# Patient Record
Sex: Female | Born: 1955 | Race: Black or African American | Hispanic: No | Smoking: Never smoker
Health system: Southern US, Community
[De-identification: ages and names within clinical notes are randomized; demographics above are authoritative.]

## PROBLEM LIST (undated history)

## (undated) DIAGNOSIS — J189 Pneumonia, unspecified organism: Secondary | ICD-10-CM

## (undated) DIAGNOSIS — Z8489 Family history of other specified conditions: Secondary | ICD-10-CM

## (undated) DIAGNOSIS — D649 Anemia, unspecified: Secondary | ICD-10-CM

## (undated) DIAGNOSIS — M069 Rheumatoid arthritis, unspecified: Secondary | ICD-10-CM

## (undated) DIAGNOSIS — I10 Essential (primary) hypertension: Secondary | ICD-10-CM

## (undated) HISTORY — PX: NECK SURGERY: SHX720

## (undated) HISTORY — PX: BACK SURGERY: SHX140

## (undated) HISTORY — PX: ABDOMINAL HYSTERECTOMY: SHX81

## (undated) HISTORY — PX: TRIGGER FINGER RELEASE: SHX641

---
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
## 2001-09-12 ENCOUNTER — Encounter: Admission: RE | Admit: 2001-09-12 | Discharge: 2001-09-12 | Payer: Self-pay | Admitting: Unknown Physician Specialty

## 2001-09-12 ENCOUNTER — Encounter: Payer: Self-pay | Admitting: Unknown Physician Specialty

## 2004-04-08 ENCOUNTER — Ambulatory Visit: Payer: Self-pay | Admitting: Family Medicine

## 2004-12-28 ENCOUNTER — Ambulatory Visit: Payer: Self-pay | Admitting: Family Medicine

## 2004-12-30 ENCOUNTER — Ambulatory Visit: Payer: Self-pay | Admitting: Family Medicine

## 2005-03-31 ENCOUNTER — Ambulatory Visit: Payer: Self-pay | Admitting: Family Medicine

## 2005-04-12 ENCOUNTER — Ambulatory Visit: Payer: Self-pay | Admitting: Family Medicine

## 2005-04-16 ENCOUNTER — Ambulatory Visit: Payer: Self-pay | Admitting: Family Medicine

## 2005-05-11 ENCOUNTER — Encounter: Admission: RE | Admit: 2005-05-11 | Discharge: 2005-07-07 | Payer: Self-pay | Admitting: Orthopedic Surgery

## 2005-05-12 ENCOUNTER — Ambulatory Visit: Payer: Self-pay | Admitting: Family Medicine

## 2005-12-22 ENCOUNTER — Encounter (HOSPITAL_COMMUNITY): Admission: RE | Admit: 2005-12-22 | Discharge: 2006-01-21 | Payer: Self-pay | Admitting: Preventative Medicine

## 2006-02-10 ENCOUNTER — Ambulatory Visit: Payer: Self-pay | Admitting: Family Medicine

## 2014-03-01 HISTORY — PX: TOTAL KNEE ARTHROPLASTY: SHX125

## 2014-05-28 ENCOUNTER — Ambulatory Visit: Payer: Self-pay | Admitting: Physical Therapy

## 2014-06-25 ENCOUNTER — Ambulatory Visit: Payer: Non-veteran care | Attending: Orthopedic Surgery | Admitting: Physical Therapy

## 2014-06-25 DIAGNOSIS — M25561 Pain in right knee: Secondary | ICD-10-CM | POA: Insufficient documentation

## 2014-06-25 DIAGNOSIS — M25661 Stiffness of right knee, not elsewhere classified: Secondary | ICD-10-CM | POA: Diagnosis not present

## 2014-06-25 NOTE — Therapy (Signed)
[facility] [address] [place], [place], [address] Phone: [phone]   Fax:  [phone]  Physical Therapy Evaluation  Patient Details  Name: Melinda Hayes MRN: [id number] Date of Birth: 09-Nov-1955 Referring Provider:  Case, Helen Hashimoto, MD  Encounter Date: 06/25/2014      PT End of Session - 06/25/14 1436    Visit Number 1   Number of Visits 18   Date for PT Re-Evaluation 08/20/14   PT Start Time 0230   PT Stop Time 0327   PT Time Calculation (min) 57 min   Behavior During Therapy [facility] for tasks assessed/performed      No past medical history on file.  No past surgical history on file.  There were no vitals filed for this visit.  Visit Diagnosis:  Right knee pain - Plan: PT plan of care cert/re-cert  Stiffness of right knee - Plan: PT plan of care cert/re-cert      Subjective Assessment - 06/25/14 1437    Subjective [facility].   Patient Stated Goals Get back to life and reduce my right knee pain.            [facility] PT Assessment - 06/25/14 0001    Assessment   Medical Diagnosis Right total knee replacement.   Onset Date --  03/26/14.   Precautions   Precaution Comments No ultrasound.   Balance Screen   Has the patient fallen in the past 6 months No   Has the patient had a decrease in activity level because of a fear of falling?  Yes   Is the patient reluctant to leave their home because of a fear of falling?  No   ROM / Strength   AROM / PROM / Strength AROM;PROM;Strength   AROM   Overall AROM Comments -12 degrees to 82 degrees.   PROM   Overall PROM Comments -10 degrees to 90 degrees.   Strength   Overall Strength Comments Righ thip strength= 4-/5 and right knee strength= 4/5.   Palpation   Palpation --  Tender around right patella and ant tib region.   Special Tests   Swelling Tests --  Mid-pat circum measurement 4 cms > on right.   Ambulation/Gait   Ambulation/Gait --  Walking with cane.                   [facility] Adult PT Treatment/Exercise - 06/25/14 0001    Modalities   Modalities Cryotherapy;Electrical Stimulation   Cryotherapy   Number Minutes Cryotherapy 20 Minutes   Cryotherapy Location Knee   Type of Cryotherapy --  Medium vasopneumatic.   Programme researcher, broadcasting/film/video Location --  Right knee.   Electrical Stimulation Action 1-10 Hz x 20 minutes at 100% scan.   Electrical Stimulation Goals Edema;Pain                     PT Long Term Goals - 06/25/14 1502    PT LONG TERM GOAL #1   Title Ind with HEP.   Time 6   Period Weeks   Status New   PT LONG TERM GOAL #2   Title Full active right knee extension.   Time 6   Period Weeks   Status New   PT LONG TERM GOAL #3   Title Active right knee flexion to 115 degrees+.   Time 6   Period Weeks   Status New   PT  LONG TERM GOAL #4   Title Right LE strength to 5/5 to increase stability for functional activites.   Time 6   Period Weeks   Status New   PT LONG TERM GOAL #5   Title Decrease edema to within 2 cms of contralateral side to assist with range of motion gains and decrease pain.   Time 6   Period Weeks   Status New   Additional Long Term Goals   Additional Long Term Goals Yes   PT LONG TERM GOAL #6   Title Perform a reciprocating stair gait with one railing with pain not > 3/10.   Time 6   Period Weeks   Status New               Plan - Jun 30, 2014 1440    Clinical Impression Statement The patient underwent a right total knee replacement on 03/26/14.   Patient has a h/o of right MCL laxity and was in an immobilizer for 6 weeks while walking for increased stability.  Pain rises to a 5-6/10 after being up awhile.  Ice and elevation decreases pain.     Pt will benefit from skilled therapeutic intervention in order to improve on the following deficits Decreased activity tolerance;Pain   Rehab Potential Good   PT  Frequency 3x / week   PT Duration 6 weeks   PT Treatment/Interventions ADLs/Self Care Home Management;Therapeutic activities;Patient/family education;Passive range of motion;Therapeutic exercise;Balance training;Gait training;Neuromuscular re-education;Electrical Stimulation;Cryotherapy   PT Next Visit Plan Needs one to one PROM to right knee; Nustep; TKR protocol.  E'stim and Vasopnuematic.          G-Codes - 2014/06/30 1506    Functional Assessment Tool Used FOTO   Functional Limitation Mobility: Walking and moving around   Mobility: Walking and Moving Around Current Status [phone]) At least 60 percent but less than 80 percent impaired, limited or restricted   Mobility: Walking and Moving Around Goal Status [phone]) At least 20 percent but less than 40 percent impaired, limited or restricted       Problem List There are no active problems to display for this patient.   Annisten Manchester, [place] MPT 2014/06/30, 3:30 PM  [facility] [address] [place], [place], [address] Phone: [phone]   Fax:  [phone]

## 2014-06-26 ENCOUNTER — Ambulatory Visit: Payer: Non-veteran care | Admitting: Physical Therapy

## 2014-06-26 DIAGNOSIS — M25661 Stiffness of right knee, not elsewhere classified: Secondary | ICD-10-CM | POA: Diagnosis not present

## 2014-06-26 DIAGNOSIS — M25561 Pain in right knee: Secondary | ICD-10-CM

## 2014-06-26 NOTE — Therapy (Signed)
[facility] [address] [place], [place], [address] Phone: [phone]   Fax:  [phone]  Physical Therapy Treatment  Patient Details  Name: Melinda Hayes MRN: [id number] Date of Birth: 12-Mar-1955 Referring Provider:  Case, Helen Hashimoto, MD  Encounter Date: 06/26/2014      PT End of Session - 06/26/14 1008    Visit Number 2   Number of Visits 18   Date for PT Re-Evaluation 08/20/14   PT Start Time 0945   PT Stop Time 1042   PT Time Calculation (min) 57 min   Activity Tolerance Patient tolerated treatment well   Behavior During Therapy [facility] for tasks assessed/performed      No past medical history on file.  No past surgical history on file.  There were no vitals filed for this visit.  Visit Diagnosis:  Right knee pain  Stiffness of right knee      Subjective Assessment - 06/26/14 1007    Subjective Knee didn't hurt as bad last night.                         [facility] Adult PT Treatment/Exercise - 06/26/14 0001    Exercises   Exercises Knee/Hip   Knee/Hip Exercises: Aerobic   Stationary Bike Nustep at level 2 moving forward times 3 to increase right knee flexion over 23 minutes.   Modalities   Modalities Cryotherapy;Electrical Stimulation   Cryotherapy   Number Minutes Cryotherapy 20 Minutes   Cryotherapy Location --  Right knee.   Type of Cryotherapy --  Medium vasopneumatic.   Programme researcher, broadcasting/film/video Location --  Right knee.   Electrical Stimulation Action --  1-10Hz  x 20 minutes.   Electrical Stimulation Goals Edema;Pain   Manual Therapy   Manual Therapy --  PROM into right knee extension x 5 minutes.                     PT Long Term Goals - 06/25/14 1502    PT LONG TERM GOAL #1   Title Ind with HEP.   Time 6   Period Weeks   Status New   PT LONG TERM GOAL #2   Title Full active right knee extension.   Time 6   Period Weeks   Status New   PT LONG  TERM GOAL #3   Title Active right knee flexion to 115 degrees+.   Time 6   Period Weeks   Status New   PT LONG TERM GOAL #4   Title Right LE strength to 5/5 to increase stability for functional activites.   Time 6   Period Weeks   Status New   PT LONG TERM GOAL #5   Title Decrease edema to within 2 cms of contralateral side to assist with range of motion gains and decrease pain.   Time 6   Period Weeks   Status New   Additional Long Term Goals   Additional Long Term Goals Yes   PT LONG TERM GOAL #6   Title Perform a reciprocating stair gait with one railing with pain not > 3/10.   Time 6   Period Weeks   Status New               Plan - 06/25/14 1440    Clinical Impression Statement The patient underwent a right total knee replacement on 03/26/14.   Patient has a h/o of right MCL laxity and was  in an immobilizer for 6 weeks while walking for increased stability.  Pain rises to a 5-6/10 after being up awhile.  Ice and elevation decreases pain.     Pt will benefit from skilled therapeutic intervention in order to improve on the following deficits Decreased activity tolerance;Pain   Rehab Potential Good   PT Frequency 3x / week   PT Duration 6 weeks   PT Treatment/Interventions ADLs/Self Care Home Management;Therapeutic activities;Patient/family education;Passive range of motion;Therapeutic exercise;Balance training;Gait training;Neuromuscular re-education;Electrical Stimulation;Cryotherapy   PT Next Visit Plan Needs one to one PROM to right knee; Nustep; TKR protocol.  E'stim and Vasopnuematic.          G-Codes - 07/25/2014 1506    Functional Assessment Tool Used FOTO   Functional Limitation Mobility: Walking and moving around   Mobility: Walking and Moving Around Current Status [phone]) At least 60 percent but less than 80 percent impaired, limited or restricted   Mobility: Walking and Moving Around Goal Status [phone]) At least 20 percent but less than 40 percent impaired,  limited or restricted      Problem List There are no active problems to display for this patient.   [place], [place] MPT 06/26/2014, 11:02 AM  [facility] [address] [place], [place], [address] Phone: [phone]   Fax:  [phone]

## 2014-07-02 ENCOUNTER — Ambulatory Visit: Payer: Non-veteran care | Attending: Orthopedic Surgery | Admitting: Physical Therapy

## 2014-07-02 DIAGNOSIS — M25561 Pain in right knee: Secondary | ICD-10-CM | POA: Diagnosis present

## 2014-07-02 DIAGNOSIS — M25661 Stiffness of right knee, not elsewhere classified: Secondary | ICD-10-CM | POA: Diagnosis not present

## 2014-07-02 NOTE — Therapy (Signed)
[facility] [address] [place], [place], [address] Phone: [phone]   Fax:  [phone]  Physical Therapy Treatment  Patient Details  Name: Melinda Hayes MRN: [id number] Date of Birth: 07-06-1955 Referring Provider:  Case, Helen Hashimoto, MD  Encounter Date: 07/02/2014      PT End of Session - 07/02/14 0906    Visit Number 3   Number of Visits 18   Date for PT Re-Evaluation 08/20/14   PT Start Time 0905   PT Stop Time 0959   PT Time Calculation (min) 54 min   Activity Tolerance Patient tolerated treatment well   Behavior During Therapy [facility] for tasks assessed/performed      No past medical history on file.  No past surgical history on file.  There were no vitals filed for this visit.  Visit Diagnosis:  Right knee pain  Stiffness of right knee      Subjective Assessment - 07/02/14 0907    Subjective States she is a little sore and hurting today.   Patient Stated Goals Get back to life and reduce my right knee pain.   Currently in Pain? Yes   Pain Score 4    Pain Location Knee   Pain Orientation Right   Pain Descriptors / Indicators Aching;Sore   Pain Type Surgical pain            [facility] PT Assessment - 07/02/14 0001    Assessment   Medical Diagnosis Right total knee replacement.   Onset Date 03/26/14   Next MD Visit 06/2014   PROM   Overall PROM  Deficits   Overall PROM Comments 9-90 deg of R knee                     [facility] Adult PT Treatment/Exercise - 07/02/14 0001    Knee/Hip Exercises: Aerobic   Stationary Bike NuStep L2 x30min for ROM   Knee/Hip Exercises: Standing   Forward Step Up Right;2 sets;10 reps;Hand Hold: 2;Step Height: 4"   Rocker Board 3 minutes   Modalities   Modalities Cryotherapy;Electrical Stimulation   Cryotherapy   Number Minutes Cryotherapy 15 Minutes   Cryotherapy Location Knee   Type of Cryotherapy Other (comment)  Medium vasopneumatic   Electrical Stimulation   Electrical Stimulation Location R knee   Electrical Stimulation Action IFC   Electrical Stimulation Parameters 1-10 Hz   Electrical Stimulation Goals Edema;Pain   Manual Therapy   Manual Therapy Passive ROM   Passive ROM R knee into flexion/extension with gentle holds at end range                     PT Long Term Goals - 07/02/14 0945    PT LONG TERM GOAL #1   Title Ind with HEP.   Time 6   Period Weeks   Status On-going   PT LONG TERM GOAL #2   Title Full active right knee extension.   Time 6   Period Weeks   Status On-going   PT LONG TERM GOAL #3   Title Active right knee flexion to 115 degrees+.   Time 6   Period Weeks   Status On-going   PT LONG TERM GOAL #4   Title Right LE strength to 5/5 to increase stability for functional activites.   Time 6   Period Weeks   Status On-going   PT LONG TERM GOAL #5   Title Decrease edema to within 2 cms of contralateral side to  assist with range of motion gains and decrease pain.   Time 6   Period Weeks   Status On-going   PT LONG TERM GOAL #6   Title Perform a reciprocating stair gait with one railing with pain not > 3/10.   Time 6   Period Weeks   Status On-going               Plan - 07/02/14 3235    Clinical Impression Statement Patient tolerated treatment well and had no complaints of pain during exercises. All LT goals remain on-going at this time due to increased pain and edema and decreased ROM and strength. Forward step ups were attempted with 6" step but patient lacked the needed R knee flexion to complete easily so step ups were completed with 4"  step. Firm end feels noted during R knee PROM. Normal modalties response noted following the removal of the modaltlies. Experienced 2/10 R knee pain following treatment.   Pt will benefit from skilled therapeutic intervention in order to improve on the following deficits Decreased activity tolerance;Pain   Rehab Potential Good   PT Frequency 3x / week    PT Duration 6 weeks   PT Treatment/Interventions ADLs/Self Care Home Management;Therapeutic activities;Patient/family education;Passive range of motion;Therapeutic exercise;Balance training;Gait training;Neuromuscular re-education;Electrical Stimulation;Cryotherapy   PT Next Visit Plan Continue per PT POC.   Consulted and Agree with Plan of Care Patient        Problem List There are no active problems to display for this patient.   Evelene Croon, PTA 07/02/2014, 10:04 AM  [facility] [address] [place], [place], [address] Phone: [phone]   Fax:  [phone]

## 2014-07-04 ENCOUNTER — Encounter: Payer: Self-pay | Admitting: Physical Therapy

## 2014-07-04 ENCOUNTER — Ambulatory Visit: Payer: Non-veteran care | Admitting: Physical Therapy

## 2014-07-04 DIAGNOSIS — M25661 Stiffness of right knee, not elsewhere classified: Secondary | ICD-10-CM

## 2014-07-04 DIAGNOSIS — M25561 Pain in right knee: Secondary | ICD-10-CM

## 2014-07-04 NOTE — Therapy (Signed)
[facility] [place], [place], [address] Phone: [phone]   Fax:  [phone]  Physical Therapy Treatment  Patient Details  Name: Melinda Hayes MRN: [id number] Date of Birth: 1955/06/20 Referring Provider:  Case, Reche Dixon, MD  Encounter Date: 07/04/2014      PT End of Session - 07/04/14 0946    Visit Number 4   Number of Visits 18   Date for PT Re-Evaluation 08/20/14   PT Start Time 0859   PT Stop Time 0959   PT Time Calculation (min) 60 min   Activity Tolerance Patient tolerated treatment well   Behavior During Therapy [facility] for tasks assessed/performed      History reviewed. No pertinent past medical history.  History reviewed. No pertinent past surgical history.  There were no vitals filed for this visit.  Visit Diagnosis:  Right knee pain  Stiffness of right knee      Subjective Assessment - 07/04/14 0907    Subjective not a lot of pain today. 0 up to 4/10 with activity   Patient Stated Goals Get back to life and reduce my right knee pain.   Currently in Pain? Yes   Pain Score 4    Pain Location Knee   Pain Orientation Right   Pain Descriptors / Indicators Sore   Pain Type Surgical pain   Aggravating Factors  increased activity   Pain Relieving Factors rest and ice            [facility] PT Assessment - 07/04/14 0001    ROM / Strength   AROM / PROM / Strength AROM;PROM   AROM   Overall AROM  Within functional limits for tasks performed   AROM Assessment Site Knee   Right/Left Knee Right   Right Knee Extension -10   Right Knee Flexion 87   PROM   Overall PROM  Deficits   Overall PROM Comments -8-95   PROM Assessment Site Knee   Right/Left Knee Right                     [facility] Adult PT Treatment/Exercise - 07/04/14 0001    Knee/Hip Exercises: Stretches   Knee: Self-Stretch to increase Flexion 3 reps;30 seconds   Knee/Hip Exercises: Aerobic   Stationary Bike Nustep L4 x 36mn for  ROM/strength   Knee/Hip Exercises: Standing   Forward Step Up Right;3 sets;10 reps   Rocker Board 3 minutes   Modalities   Modalities Cryotherapy;Electrical Stimulation   Cryotherapy   Number Minutes Cryotherapy 15 Minutes   Cryotherapy Location Knee   Type of Cryotherapy --  vasopnumatic   EAcupuncturistLocation R knee   Electrical Stimulation Action IFC   Electrical Stimulation Parameters 1-10HZ    Electrical Stimulation Goals Edema;Pain   Manual Therapy   Manual Therapy Passive ROM   Passive ROM low load holds for flexion/ext                PT Education - 07/04/14 0317 018 0239   Education provided Yes   Education Details HEP   Person(s) Educated Patient   Methods Explanation;Demonstration;Handout   Comprehension Verbalized understanding;Returned demonstration             PT Long Term Goals - 07/02/14 0945    PT LONG TERM GOAL #1   Title Ind with HEP.   Time 6   Period Weeks   Status On-going   PT LONG TERM GOAL #2   Title Full  active right knee extension.   Time 6   Period Weeks   Status On-going   PT LONG TERM GOAL #3   Title Active right knee flexion to 115 degrees+.   Time 6   Period Weeks   Status On-going   PT LONG TERM GOAL #4   Title Right LE strength to 5/5 to increase stability for functional activites.   Time 6   Period Weeks   Status On-going   PT LONG TERM GOAL #5   Title Decrease edema to within 2 cms of contralateral side to assist with range of motion gains and decrease pain.   Time 6   Period Weeks   Status On-going   PT LONG TERM GOAL #6   Title Perform a reciprocating stair gait with one railing with pain not > 3/10.   Time 6   Period Weeks   Status On-going               Plan - 07/04/14 6940    Clinical Impression Statement patient is slowly progressing with ROM in knee, HEP given for self ROM. patient was unable to have much therapy due to insurance per patient. no further goals met  due to ROM restrictions, edema, pain and weakness.   Pt will benefit from skilled therapeutic intervention in order to improve on the following deficits Decreased activity tolerance;Pain   Rehab Potential Good   Clinical Impairments Affecting Rehab Potential surgery 03/26/14 (14 weeks 07/02/14)   PT Frequency 3x / week   PT Duration 6 weeks   PT Treatment/Interventions ADLs/Self Care Home Management;Therapeutic activities;Patient/family education;Passive range of motion;Therapeutic exercise;Balance training;Gait training;Neuromuscular re-education;Electrical Stimulation;Cryotherapy   PT Next Visit Plan Continue per PT POC.   Consulted and Agree with Plan of Care Patient        Problem List There are no active problems to display for this patient.   Phillips Climes, PTA 07/04/2014, 9:59 AM  [facility] [place], [place], [address] Phone: [phone]   Fax:  [phone]

## 2014-07-04 NOTE — Patient Instructions (Signed)
    Place one foot on table. Straighten leg and attempt to keep it straight, then push down until feel a stretch. Hold _30__ seconds. Repeat __5-10_ times each leg, alternating. Do _2-4__ sessions per day.  Chair Knee Flexion   Keeping feet on floor, slide foot of operated leg back, bending knee. Hold ___30_ seconds. Repeat __5__ times. Do __2-4__ sessions a day.  Heel Slide   Bend left knee and pull heel toward buttocks. Use strap around foot and pull strap with arms to assist knee to bend further. Hold 10 secs.  Repeat _10___ times. Do __3-4__ sessions per day.

## 2014-07-09 ENCOUNTER — Encounter: Payer: Non-veteran care | Admitting: Physical Therapy

## 2014-07-11 ENCOUNTER — Encounter: Payer: Self-pay | Admitting: *Deleted

## 2014-07-11 ENCOUNTER — Ambulatory Visit: Payer: Non-veteran care | Admitting: *Deleted

## 2014-07-11 DIAGNOSIS — M25561 Pain in right knee: Secondary | ICD-10-CM

## 2014-07-11 DIAGNOSIS — M25661 Stiffness of right knee, not elsewhere classified: Secondary | ICD-10-CM | POA: Diagnosis not present

## 2014-07-11 NOTE — Therapy (Signed)
[facility] [address] [place], [place], [address] Phone: [phone]   Fax:  [phone]  Physical Therapy Treatment  Patient Details  Name: Melinda Hayes MRN: [id number] Date of Birth: 13-Sep-1955 Referring Provider:  Case, Helen Hashimoto, MD  Encounter Date: 07/11/2014      PT End of Session - 07/11/14 1110    Visit Number 5   Number of Visits 18   Date for PT Re-Evaluation 08/20/14   PT Start Time 0854   PT Stop Time 0955   PT Time Calculation (min) 61 min   Activity Tolerance Patient tolerated treatment well   Behavior During Therapy [facility] for tasks assessed/performed      History reviewed. No pertinent past medical history.  History reviewed. No pertinent past surgical history.  There were no vitals filed for this visit.  Visit Diagnosis:  Right knee pain  Stiffness of right knee      Subjective Assessment - 07/11/14 1001    Subjective pain has bee 5-6/10. Has been doing HEP 3-4 times a day. Hurts bottom of knee all the way across. Back of knee sore too.   Limitations Walking;House hold activities   Patient Stated Goals wants less pain and swelling   Currently in Pain? Yes   Pain Score 6    Pain Location Knee   Pain Orientation Right   Pain Descriptors / Indicators Aching;Tightness;Constant;Sore   Pain Type Surgical pain   Pain Onset More than a month ago   Pain Frequency Constant   Effect of Pain on Daily Activities rest and ice   Multiple Pain Sites No                         [facility] Adult PT Treatment/Exercise - 07/11/14 0001    Knee/Hip Exercises: Aerobic   Stationary Bike --  Nustep x 15 min L4   Modalities   Modalities --  cold pack, ES   Cryotherapy   Number Minutes Cryotherapy 20 Minutes   Cryotherapy Location Knee   Type of Cryotherapy --  cold pack   Electrical Stimulation   Electrical Stimulation Location --  rt knee   Electrical Stimulation Action --  IFC   Electrical  Stimulation Parameters --  1-10 HZ   Electrical Stimulation Goals --  edema pain   Manual Therapy   Manual Therapy --  x25 min of stretching into extesion and flexion with holds   Passive ROM --  low load long duration holds                     PT Long Term Goals - 07/02/14 0945    PT LONG TERM GOAL #1   Title Ind with HEP.   Time 6   Period Weeks   Status On-going   PT LONG TERM GOAL #2   Title Full active right knee extension.   Time 6   Period Weeks   Status On-going   PT LONG TERM GOAL #3   Title Active right knee flexion to 115 degrees+.   Time 6   Period Weeks   Status On-going   PT LONG TERM GOAL #4   Title Right LE strength to 5/5 to increase stability for functional activites.   Time 6   Period Weeks   Status On-going   PT LONG TERM GOAL #5   Title Decrease edema to within 2 cms of contralateral side to assist with range of motion gains and  decrease pain.   Time 6   Period Weeks   Status On-going   PT LONG TERM GOAL #6   Title Perform a reciprocating stair gait with one railing with pain not > 3/10.   Time 6   Period Weeks   Status On-going               Plan - 01-Aug-2014 1112    Clinical Impression Statement Patient trying to do HEP and feels it will improve her ROM and strength. So far, it has made the pain and edema worse.  ROM is -10 ext  and 95 flex   Pt will benefit from skilled therapeutic intervention in order to improve on the following deficits Abnormal gait;Decreased strength;Pain;Increased edema;Decreased range of motion;Decreased endurance   Rehab Potential Good   PT Frequency 3x / week   PT Duration 6 weeks   PT Treatment/Interventions ADLs/Self Care Home Management;Therapeutic activities;Manual techniques;Therapeutic exercise;Cryotherapy;Electrical Stimulation;Gait training;Moist Heat;Passive range of motion   PT Next Visit Plan cont poc   Consulted and Agree with Plan of Care Patient          G-Codes - August 01, 2014  1127    Functional Assessment Tool Used Foto    Intake status 26%, today status 43%        Intake limitation 74%, today 57%   Functional Limitation Mobility: Walking and moving around      Problem List There are no active problems to display for this patient.   Cristal Ford P,PTA  2014-08-01, 11:33 AM  [facility] [address] [place], [place], [address] Phone: [phone]   Fax:  [phone]

## 2014-07-12 ENCOUNTER — Ambulatory Visit: Payer: Non-veteran care | Admitting: *Deleted

## 2014-07-12 DIAGNOSIS — M25661 Stiffness of right knee, not elsewhere classified: Secondary | ICD-10-CM

## 2014-07-12 DIAGNOSIS — M25561 Pain in right knee: Secondary | ICD-10-CM

## 2014-07-12 NOTE — Therapy (Signed)
[facility] [address] [place], [place], [address] Phone: [phone]   Fax:  [phone]  Physical Therapy Treatment  Patient Details  Name: Melinda Hayes MRN: [id number] Date of Birth: 11-Jan-1956 Referring Provider:  Case, Helen Hashimoto, MD  Encounter Date: 07/12/2014      PT End of Session - 07/12/14 0911    Visit Number 6   Number of Visits 18   Date for PT Re-Evaluation 08/20/14   PT Start Time 0900   PT Stop Time 0959   PT Time Calculation (min) 59 min      No past medical history on file.  No past surgical history on file.  There were no vitals filed for this visit.  Visit Diagnosis:  Right knee pain  Stiffness of right knee      Subjective Assessment - 07/12/14 0913    Subjective pain has bee 3/10. Has been doing HEP 3-4 times a day. Hurts bottom of knee all the way across. Back of knee sore too.Able to sleep better last night   Limitations Walking;House hold activities   Patient Stated Goals wants less pain and swelling   Currently in Pain? Yes   Pain Score 3    Pain Location Knee   Pain Orientation Right   Pain Descriptors / Indicators Aching;Tightness   Pain Type Surgical pain   Pain Onset More than a month ago   Pain Frequency Intermittent   Aggravating Factors  increased activity   Pain Relieving Factors rest,ice                         [facility] Adult PT Treatment/Exercise - 07/12/14 0001    Exercises   Exercises Knee/Hip   Knee/Hip Exercises: Aerobic   Stationary Bike Nustep L4 x for ROM/strength   Knee/Hip Exercises: Standing   Rocker Board 4 minutes   Other Standing Knee Exercises *in block flexion lunges x10   Modalities   Modalities Cryotherapy;Electrical Stimulation   Cryotherapy   Number Minutes Cryotherapy 15 Minutes   Cryotherapy Location Knee   Type of Cryotherapy Ice pack  Vasopnuematic   Programme researcher, broadcasting/film/video Location R knee   Electrical Stimulation Action IFC   Electrical Stimulation Parameters 1-10hz  x15 min   Electrical Stimulation Goals Edema   Manual Therapy   Manual Therapy Passive ROM;Soft tissue mobilization   Soft tissue mobilization IASTM to RT knee around incision and med/lat aspect. STW post. aspect   Passive ROM low load holds for flexion/ext and HS stretches using contract/relax                     PT Long Term Goals - 07/02/14 0945    PT LONG TERM GOAL #1   Title Ind with HEP.   Time 6   Period Weeks   Status On-going   PT LONG TERM GOAL #2   Title Full active right knee extension.   Time 6   Period Weeks   Status On-going   PT LONG TERM GOAL #3   Title Active right knee flexion to 115 degrees+.   Time 6   Period Weeks   Status On-going   PT LONG TERM GOAL #4   Title Right LE strength to 5/5 to increase stability for functional activites.   Time 6   Period Weeks   Status On-going   PT LONG TERM GOAL #5   Title Decrease edema to within 2 cms of  contralateral side to assist with range of motion gains and decrease pain.   Time 6   Period Weeks   Status On-going   PT LONG TERM GOAL #6   Title Perform a reciprocating stair gait with one railing with pain not > 3/10.   Time 6   Period Weeks   Status On-going               Plan - 07/12/14 0912    Clinical Impression Statement Pt did good with exs and activities. She had less soreness and pain Posterior aspect of RT knee today. Her ROM for flexion has progressed to 95 degrees. I discussed with her about performing her flexion stretches  on steps at home to increase ROM   Pt will benefit from skilled therapeutic intervention in order to improve on the following deficits Abnormal gait;Decreased strength;Pain;Increased edema;Decreased range of motion;Decreased endurance   Rehab Potential Good   Clinical Impairments Affecting Rehab Potential surgery 03/26/14 (14 weeks 07/02/14)   PT Duration 6 weeks   PT  Treatment/Interventions ADLs/Self Care Home Management;Therapeutic activities;Manual techniques;Therapeutic exercise;Cryotherapy;Electrical Stimulation;Gait training;Moist Heat;Passive range of motion   PT Next Visit Plan cont poc   Consulted and Agree with Plan of Care Patient          G-Codes - 2014/07/27 1127    Functional Assessment Tool Used Foto    Intake status 26%, today status 43%        Intake limitation 74%, today 57%   Functional Limitation Mobility: Walking and moving around      Problem List There are no active problems to display for this patient.   Shneur Whittenburg,CHRIS, PTA 07/12/2014, 11:14 AM  [facility] [address] [place], [place], [address] Phone: [phone]   Fax:  [phone]

## 2014-07-16 ENCOUNTER — Ambulatory Visit: Payer: Non-veteran care | Admitting: Physical Therapy

## 2014-07-16 DIAGNOSIS — M25661 Stiffness of right knee, not elsewhere classified: Secondary | ICD-10-CM | POA: Diagnosis not present

## 2014-07-16 DIAGNOSIS — M25561 Pain in right knee: Secondary | ICD-10-CM

## 2014-07-16 NOTE — Therapy (Signed)
[facility] [address] [place], [place], [address] Phone: [phone]   Fax:  [phone]  Physical Therapy Treatment  Patient Details  Name: Melinda Hayes MRN: [id number] Date of Birth: [birth year] Referring Provider:  Case, Helen Hashimoto, MD  Encounter Date: 07/16/2014      PT End of Session - 07/16/14 0904    Visit Number 7   Number of Visits 18   Date for PT Re-Evaluation 08/20/14   PT Start Time 0900   PT Stop Time 0949   PT Time Calculation (min) 49 min   Activity Tolerance Patient tolerated treatment well   Behavior During Therapy [facility] for tasks assessed/performed      No past medical history on file.  No past surgical history on file.  There were no vitals filed for this visit.  Visit Diagnosis:  Right knee pain  Stiffness of right knee      Subjective Assessment - 07/16/14 0901    Subjective States this morning she woke up real stiff.   Limitations Walking;House hold activities   Patient Stated Goals wants less pain and swelling   Currently in Pain? Yes   Pain Score 4    Pain Location Knee   Pain Orientation Right   Pain Descriptors / Indicators Aching;Sore   Pain Type Surgical pain   Pain Onset More than a month ago            [facility] PT Assessment - 07/16/14 0001    Assessment   Medical Diagnosis Right total knee replacement.   Onset Date 03/26/14   Next MD Visit 06/2014                     [facility] Adult PT Treatment/Exercise - 07/16/14 0001    Knee/Hip Exercises: Stretches   Knee: Self-Stretch to increase Flexion 3 reps;30 seconds   Knee/Hip Exercises: Aerobic   Stationary Bike NuStep L6 x48min   Knee/Hip Exercises: Standing   Rocker Board 3 minutes   Knee/Hip Exercises: Seated   Other Seated Knee Exercises --   Modalities   Modalities Cryotherapy;Electrical Stimulation   Cryotherapy   Number Minutes Cryotherapy 15 Minutes   Cryotherapy Location Knee   Type of Cryotherapy Other  (comment)  Medium Vasopneumatic   Electrical Stimulation   Electrical Stimulation Location R knee   Electrical Stimulation Action IFC   Electrical Stimulation Parameters 1-10 Hz x15 min   Electrical Stimulation Goals Edema;Pain   Manual Therapy   Manual Therapy Passive ROM   Passive ROM RLE into flexion/extension with gentle holds at end range                     PT Long Term Goals - 07/16/14 7425    PT LONG TERM GOAL #1   Title Ind with HEP.   Time 6   Period Weeks   Status Achieved   PT LONG TERM GOAL #2   Title Full active right knee extension.   Time 6   Period Weeks   Status On-going   PT LONG TERM GOAL #3   Title Active right knee flexion to 115 degrees+.   Time 6   Period Weeks   Status On-going   PT LONG TERM GOAL #4   Title Right LE strength to 5/5 to increase stability for functional activites.   Time 6   Period Weeks   Status On-going   PT LONG TERM GOAL #5   Title Decrease edema to within 2  cms of contralateral side to assist with range of motion gains and decrease pain.   Time 6   Period Weeks   Status On-going   PT LONG TERM GOAL #6   Title Perform a reciprocating stair gait with one railing with pain not > 3/10.   Time 6   Period Weeks   Status On-going               Plan - 07/16/14 0935    Clinical Impression Statement Patient completed exercises well while dealing with stiffness in RLE today. Firm end feels noted during PROM of R knee. Required a short rest break following PROM of R knee due to pain. Required increased time during exercises in order to complete exericses. Normal modalties response noted following the removal of the modalties. Denied pain only experienced numbness following vasopneumatic system.   Pt will benefit from skilled therapeutic intervention in order to improve on the following deficits Abnormal gait;Decreased strength;Pain;Increased edema;Decreased range of motion;Decreased endurance   Rehab Potential  Good   Clinical Impairments Affecting Rehab Potential surgery 03/26/14 (14 weeks 07/02/14)   PT Frequency 3x / week   PT Duration 6 weeks   PT Treatment/Interventions ADLs/Self Care Home Management;Therapeutic activities;Manual techniques;Therapeutic exercise;Cryotherapy;Electrical Stimulation;Gait training;Moist Heat;Passive range of motion   PT Next Visit Plan Continue per PT POC. Consider continuing contract/relax to increase R knee flexion and reassess R knee ROM next treatment.   Consulted and Agree with Plan of Care Patient        Problem List There are no active problems to display for this patient.   Evelene Croon, PTA 07/16/2014, 9:53 AM  [facility] [address] [place], [place], [address] Phone: [phone]   Fax:  [phone]

## 2014-07-18 ENCOUNTER — Ambulatory Visit: Payer: Non-veteran care | Admitting: *Deleted

## 2014-07-18 ENCOUNTER — Encounter: Payer: Self-pay | Admitting: *Deleted

## 2014-07-18 DIAGNOSIS — M25661 Stiffness of right knee, not elsewhere classified: Secondary | ICD-10-CM | POA: Diagnosis not present

## 2014-07-18 DIAGNOSIS — M25561 Pain in right knee: Secondary | ICD-10-CM

## 2014-07-18 NOTE — Therapy (Signed)
[facility] [address] [place], [place], [address] Phone: [phone]   Fax:  [phone]  Patient Details  Name: Melinda Hayes MRN: [id number] Date of Birth: 03-15-1955 Referring Provider:  Case, Helen Hashimoto, MD  Encounter Date: 07/18/2014   Cristal Ford P,PTA 07/18/2014, 10:24 AM  [facility] [address] [place], [place], [address] Phone: [phone]   Fax:  [phone]

## 2014-07-23 ENCOUNTER — Ambulatory Visit: Payer: Non-veteran care | Admitting: Physical Therapy

## 2014-07-23 DIAGNOSIS — M25661 Stiffness of right knee, not elsewhere classified: Secondary | ICD-10-CM | POA: Diagnosis not present

## 2014-07-23 DIAGNOSIS — M25561 Pain in right knee: Secondary | ICD-10-CM

## 2014-07-23 NOTE — Therapy (Signed)
[facility] [address] [place], [place], [address] Phone: [phone]   Fax:  [phone]  Physical Therapy Treatment  Patient Details  Name: Melinda Hayes MRN: [id number] Date of Birth: Jan 25, 1956 Referring Provider:  Case, Helen Hashimoto, MD  Encounter Date: 07/23/2014      PT End of Session - 07/23/14 0905    Visit Number 9   Number of Visits 18   Date for PT Re-Evaluation 08/20/14   PT Start Time 0859   PT Stop Time 0959   PT Time Calculation (min) 60 min   Activity Tolerance Patient tolerated treatment well   Behavior During Therapy [facility] for tasks assessed/performed      No past medical history on file.  No past surgical history on file.  There were no vitals filed for this visit.  Visit Diagnosis:  Right knee pain  Stiffness of right knee      Subjective Assessment - 07/23/14 0903    Subjective States that she fell last Friday but not on her knee but is real sore and achey afterward.   Limitations Walking;House hold activities   Patient Stated Goals wants less pain and swelling   Currently in Pain? Yes   Pain Score 4    Pain Location Knee   Pain Orientation Right   Pain Descriptors / Indicators Aching;Sore   Pain Type Surgical pain   Pain Onset More than a month ago   Pain Frequency Constant            [facility] PT Assessment - 07/23/14 0001    Assessment   Medical Diagnosis Right total knee replacement.   Onset Date/Surgical Date 03/26/14   Next MD Visit 07/23/2014   ROM / Strength   AROM / PROM / Strength AROM   AROM   AROM Assessment Site Knee   Right/Left Knee Right   Right Knee Extension -4   Right Knee Flexion 85   Special Tests   Swelling Tests --  L knee midpatellar 52 cm, R knee midpatellar 54 cm                     [facility] Adult PT Treatment/Exercise - 07/23/14 0001    Knee/Hip Exercises: Aerobic   Stationary Bike NuStep L6 x58min    Knee/Hip Exercises: Standing   Forward Lunges  Right;3 sets;10 reps   Lateral Step Up Right;3 sets;10 reps;Step Height: 6"   Forward Step Up Right;3 sets;10 reps;Step Height: 6"   Rocker Board 3 minutes   Knee/Hip Exercises: Seated   Other Seated Knee Exercises --   Modalities   Modalities Cryotherapy;Electrical Stimulation   Cryotherapy   Number Minutes Cryotherapy 15 Minutes   Cryotherapy Location Knee   Type of Cryotherapy Other (comment)  Medium vasopneumatic   Electrical Stimulation   Electrical Stimulation Location R knee   Electrical Stimulation Action IFC   Electrical Stimulation Parameters 1-10 Hz x8min   Electrical Stimulation Goals Edema;Pain   Manual Therapy   Manual Therapy Passive ROM   Passive ROM RLE into flexion/extension with gentle holds at end range                     PT Long Term Goals - 07/23/14 0909    PT LONG TERM GOAL #1   Title Ind with HEP.   Time 6   Period Weeks   Status Achieved   PT LONG TERM GOAL #2   Title Full active right knee extension.  Time 6   Period Weeks   Status On-going   PT LONG TERM GOAL #3   Title Active right knee flexion to 115 degrees+.   Time 6   Period Weeks   Status On-going   PT LONG TERM GOAL #4   Title Right LE strength to 5/5 to increase stability for functional activites.   Time 6   Period Weeks   Status On-going   PT LONG TERM GOAL #5   Title Decrease edema to within 2 cms of contralateral side to assist with range of motion gains and decrease pain.   Time 6   Period Weeks   Status Achieved   PT LONG TERM GOAL #6   Title Perform a reciprocating stair gait with one railing with pain not > 3/10.   Time 6   Period Weeks   Status Achieved               Plan - 07/23/14 0945    Clinical Impression Statement Patient tolerated treatment well today. Acheived LT goal #5(edema within 2cm of each other) and #6 (able to ambulate stairs reciprically). AROM of R knee 4-85 deg today. From observation of B knees swelling seems to be  concentrated around R patella and inferior to the knee joint. Normal modalties response noted following removal of the modalites.    Pt will benefit from skilled therapeutic intervention in order to improve on the following deficits Abnormal gait;Decreased strength;Pain;Decreased activity tolerance;Increased edema;Decreased range of motion;Decreased endurance   Rehab Potential Good   Clinical Impairments Affecting Rehab Potential surgery 03/26/14 (14 weeks 07/02/14)   PT Frequency 3x / week   PT Duration 6 weeks   PT Treatment/Interventions ADLs/Self Care Home Management;Therapeutic activities;Manual techniques;Therapeutic exercise;Cryotherapy;Electrical Stimulation;Scar mobilization   PT Next Visit Plan Continue per PT POC. Sees Dr. Case 07/22/2013   Consulted and Agree with Plan of Care Patient        Problem List There are no active problems to display for this patient.   Florence Canner, PTA 07/23/2014 10:05 AM   [facility] [address] [place], [place], [address] Phone: [phone]   Fax:  [phone]

## 2014-07-25 ENCOUNTER — Ambulatory Visit: Payer: Non-veteran care | Admitting: Physical Therapy

## 2014-07-25 DIAGNOSIS — M25661 Stiffness of right knee, not elsewhere classified: Secondary | ICD-10-CM

## 2014-07-25 DIAGNOSIS — M25561 Pain in right knee: Secondary | ICD-10-CM

## 2014-07-25 NOTE — Therapy (Signed)
[facility] [place], [place], [address] Phone: [phone]   Fax:  [phone]  Physical Therapy Treatment  Patient Details  Name: Melinda Hayes MRN: [id number] Date of Birth: 1956-02-17 Referring Provider:  Case, Reche Dixon, MD  Encounter Date: 07/25/2014      PT End of Session - 07/25/14 0904    Visit Number 10   Number of Visits 18   Date for PT Re-Evaluation 08/20/14   PT Start Time 0903   PT Stop Time 0956   PT Time Calculation (min) 53 min   Activity Tolerance Patient tolerated treatment well   Behavior During Therapy [facility] for tasks assessed/performed      No past medical history on file.  No past surgical history on file.  There were no vitals filed for this visit.  Visit Diagnosis:  Right knee pain  Stiffness of right knee      Subjective Assessment - 07/25/14 0904    Subjective Still a little sore from the fall last week. Dr. Case said that everything seemed to look good to him.   Limitations Walking;House hold activities   Patient Stated Goals wants less pain and swelling   Currently in Pain? No/denies            [facility] PT Assessment - 07/25/14 0001    Assessment   Medical Diagnosis Right total knee replacement.   Onset Date/Surgical Date 03/26/14                     [facility] Adult PT Treatment/Exercise - 07/25/14 0001    Knee/Hip Exercises: Aerobic   Stationary Bike NuStep L7 x46mn   Knee/Hip Exercises: Standing   Forward Lunges Right;3 sets;10 reps   Lateral Step Up Right;3 sets;10 reps;Step Height: 6"   Forward Step Up Right;3 sets;10 reps;Step Height: 6"   Modalities   Modalities Cryotherapy;Electrical Stimulation   Cryotherapy   Number Minutes Cryotherapy 15 Minutes   Cryotherapy Location Knee   Type of Cryotherapy Other (comment)  Medium Vasopneumatic    Electrical Stimulation   Electrical Stimulation Location R knee   Electrical Stimulation Action IFC   Electrical  Stimulation Parameters 1-10Hz  x15 min   Electrical Stimulation Goals Edema;Pain   Manual Therapy   Manual Therapy Passive ROM   Passive ROM RLE into flexion/extension with gentle holds at end range                     PT Long Term Goals - 07/25/14 09702   PT LONG TERM GOAL #1   Title Ind with HEP.   Time 6   Period Weeks   Status Achieved   PT LONG TERM GOAL #2   Title Full active right knee extension.   Time 6   Period Weeks   Status On-going   PT LONG TERM GOAL #3   Title Active right knee flexion to 115 degrees+.   Time 6   Period Weeks   Status On-going   PT LONG TERM GOAL #4   Title Right LE strength to 5/5 to increase stability for functional activites.   Time 6   Period Weeks   Status On-going   PT LONG TERM GOAL #5   Title Decrease edema to within 2 cms of contralateral side to assist with range of motion gains and decrease pain.   Time 6   Period Weeks   Status Achieved   PT LONG TERM GOAL #6   Title Perform  a reciprocating stair gait with one railing with pain not > 3/10.   Time 6   Period Weeks   Status Achieved               Plan - 07/25/14 0944    Clinical Impression Statement Patient tolerated treatment well today without complaint of pain during exericses. No goals were met today. Continued observation of swelling mostly around R patella and inferior to R knee joint. Normal modalties response noted following removal of the modalties. Denied pain following trreatment.   Pt will benefit from skilled therapeutic intervention in order to improve on the following deficits Abnormal gait;Decreased strength;Pain;Decreased activity tolerance;Increased edema;Decreased range of motion;Decreased endurance   Rehab Potential Good   Clinical Impairments Affecting Rehab Potential surgery 03/26/14 (14 weeks 07/02/14)   PT Frequency 3x / week   PT Duration 6 weeks   PT Treatment/Interventions ADLs/Self Care Home Management;Therapeutic activities;Manual  techniques;Therapeutic exercise;Cryotherapy;Electrical Stimulation;Scar mobilization   PT Next Visit Plan Continue per PT POC. Notify MPT of need to start [facility]   Consulted and Agree with Plan of Care Patient        Problem List There are no active problems to display for this patient.   Ahmed Prima, PTA 07/25/2014 10:02 AM   [facility] [address] [place], [place], [address] Phone: [phone]   Fax:  [phone]

## 2014-07-30 ENCOUNTER — Ambulatory Visit: Payer: Non-veteran care | Admitting: Physical Therapy

## 2014-07-30 DIAGNOSIS — M25561 Pain in right knee: Secondary | ICD-10-CM

## 2014-07-30 DIAGNOSIS — M25661 Stiffness of right knee, not elsewhere classified: Secondary | ICD-10-CM | POA: Diagnosis not present

## 2014-07-30 NOTE — Patient Instructions (Signed)
Knee Flexion: Resisted (Sitting)   Sit with band under left foot and looped around ankle of supported leg. Pull unsupported leg back. Repeat __10__ times per set. Do __3__ sets per session. Do __2__ sessions per day.  http://orth.exer.us/694   Copyright  VHI. All rights reserved.  Knee Extension: Resisted (Sitting)   With band looped around right ankle and under other foot, straighten leg with ankle loop. Keep other leg bent to increase resistance. Repeat __10__ times per set. Do __3__ sets per session. Do __2__ sessions per day.  http://orth.exer.us/690   Copyright  VHI. All rights reserved.

## 2014-07-30 NOTE — Therapy (Signed)
[facility] [place], [place], [address] Phone: [phone]   Fax:  [phone]  Physical Therapy Treatment  Patient Details  Name: Melinda Hayes MRN: [id number] Date of Birth: 1955-08-25 Referring Provider:  Case, Reche Dixon, MD  Encounter Date: 07/30/2014      PT End of Session - 07/30/14 0906    Visit Number 11   Number of Visits 18   Date for PT Re-Evaluation 08/20/14   PT Start Time 0906   PT Stop Time 0954   PT Time Calculation (min) 48 min   Activity Tolerance Patient tolerated treatment well   Behavior During Therapy [facility] for tasks assessed/performed      No past medical history on file.  No past surgical history on file.  There were no vitals filed for this visit.  Visit Diagnosis:  Right knee pain  Stiffness of right knee      Subjective Assessment - 07/30/14 0906    Subjective States that she has some stiffness and some tingling. Reports a popping sensation in R knee as well.   Limitations Walking;House hold activities   Patient Stated Goals wants less pain and swelling   Currently in Pain? Yes   Pain Score 5    Pain Location Knee   Pain Orientation Right   Pain Descriptors / Indicators Other (Comment);Tingling  Stiffness   Pain Type Surgical pain   Pain Onset More than a month ago            [facility] PT Assessment - 07/30/14 0001    Assessment   Medical Diagnosis Right total knee replacement.   Onset Date/Surgical Date 03/26/14                     [facility] Adult PT Treatment/Exercise - 07/30/14 0001    Knee/Hip Exercises: Aerobic   Stationary Bike NuStep L8 x70mn   Knee/Hip Exercises: Standing   Forward Lunges Right;3 sets;10 reps   Rocker Board 3 minutes   Knee/Hip Exercises: Seated   Other Seated Knee Exercises L knee ext/ HS curl red theraband x30 reps each   Modalities   Modalities Cryotherapy;Electrical Stimulation   Cryotherapy   Number Minutes Cryotherapy 15  Minutes   Cryotherapy Location Knee   Type of Cryotherapy Ice pack   Electrical Stimulation   Electrical Stimulation Location R knee   Electrical Stimulation Action IFC   Electrical Stimulation Parameters 1-10 Hz x131m   Electrical Stimulation Goals Edema;Pain   Manual Therapy   Manual Therapy Passive ROM   Passive ROM RLE into flexion/extension with gentle holds at end range                PT Education - 07/30/14 0941    Education provided Yes   Education Details HEP- seated HS curl and knee extension strengthening with red theraband    Person(s) Educated Patient   Methods Explanation;Demonstration;Verbal cues;Handout   Comprehension Verbalized understanding;Returned demonstration;Verbal cues required             PT Long Term Goals - 07/30/14 0908    PT LONG TERM GOAL #1   Title Ind with HEP.   Time 6   Period Weeks   Status Achieved   PT LONG TERM GOAL #2   Title Full active right knee extension.   Time 6   Period Weeks   Status On-going   PT LONG TERM GOAL #3   Title Active right knee flexion to 115 degrees+.   Time  6   Period Weeks   Status On-going   PT LONG TERM GOAL #4   Title Right LE strength to 5/5 to increase stability for functional activites.   Time 6   Period Weeks   Status On-going   PT LONG TERM GOAL #5   Title Decrease edema to within 2 cms of contralateral side to assist with range of motion gains and decrease pain.   Time 6   Period Weeks   Status Achieved   PT LONG TERM GOAL #6   Title Perform a reciprocating stair gait with one railing with pain not > 3/10.   Time 6   Period Weeks   Status Achieved               Plan - 07/30/14 9558    Clinical Impression Statement Patient tolerated treatment well today without complaint of pain during exercises. No goals were met today due to decreased R knee extension, flexion, strength. Continued observation of swelling mostly around R patella and inferior to R knee. Observed the  popping sensation during palpation of the R knee during R forward lunge. Normal modalites response noted following removal of the modalties. Accepted new HEP for R knee strengthening and red theraband this visit. Denied pain only numbness following treatment.   Pt will benefit from skilled therapeutic intervention in order to improve on the following deficits Abnormal gait;Decreased strength;Pain;Decreased activity tolerance;Increased edema;Decreased range of motion;Decreased endurance   Clinical Impairments Affecting Rehab Potential surgery 03/26/14 (14 weeks 07/02/14)   PT Frequency 3x / week   PT Duration 6 weeks   PT Treatment/Interventions ADLs/Self Care Home Management;Therapeutic activities;Manual techniques;Therapeutic exercise;Cryotherapy;Electrical Stimulation;Scar mobilization   PT Next Visit Plan Continue per PT POC. Consider IASTW to R posterior knee next session to decrease R hamstring tightness in effort to advance R knee ROM.   Consulted and Agree with Plan of Care Patient        Problem List There are no active problems to display for this patient.   Wynelle Fanny, PTA 07/30/2014, 9:57 AM  [facility] [address] [place], [place], [address] Phone: [phone]   Fax:  [phone]

## 2014-08-01 ENCOUNTER — Ambulatory Visit: Payer: No Typology Code available for payment source | Attending: Orthopedic Surgery | Admitting: Physical Therapy

## 2014-08-01 DIAGNOSIS — M25661 Stiffness of right knee, not elsewhere classified: Secondary | ICD-10-CM | POA: Diagnosis present

## 2014-08-01 DIAGNOSIS — M25561 Pain in right knee: Secondary | ICD-10-CM | POA: Insufficient documentation

## 2014-08-01 NOTE — Therapy (Signed)
[facility] [address] [place], [place], [address] Phone: [phone]   Fax:  [phone]  Physical Therapy Treatment  Patient Details  Name: Sherrill Buikema MRN: [id number] Date of Birth: [birth year] Referring Provider:  Case, Helen Hashimoto, MD  Encounter Date: 08/01/2014      PT End of Session - 08/01/14 0907    Visit Number 12   Number of Visits 18   Date for PT Re-Evaluation 08/20/14   PT Start Time 0904   PT Stop Time 0953   PT Time Calculation (min) 49 min   Activity Tolerance Patient tolerated treatment well   Behavior During Therapy [facility] for tasks assessed/performed      No past medical history on file.  No past surgical history on file.  There were no vitals filed for this visit.  Visit Diagnosis:  Right knee pain  Stiffness of right knee      Subjective Assessment - 08/01/14 0906    Subjective States that she has a little soreness but no pain. "If we could just get the swelling down. That's been since 7:30am when I got up."   Limitations Walking;House hold activities   Patient Stated Goals wants less pain and swelling   Currently in Pain? No/denies            [facility] PT Assessment - 08/01/14 0001    Assessment   Medical Diagnosis Right total knee replacement.   Onset Date/Surgical Date 03/26/14                     [facility] Adult PT Treatment/Exercise - 08/01/14 0001    Knee/Hip Exercises: Aerobic   Stationary Bike NuStep L8 x14min   Knee/Hip Exercises: Standing   Forward Lunges Right;3 sets;10 reps   Rocker Board 3 minutes   Modalities   Modalities Cryotherapy;Electrical Stimulation   Cryotherapy   Number Minutes Cryotherapy 15 Minutes   Cryotherapy Location Knee   Electrical Stimulation   Electrical Stimulation Location R knee   Electrical Stimulation Action IFC   Electrical Stimulation Parameters 1-10 Hz x11min   Electrical Stimulation Goals Edema;Pain   Manual Therapy   Manual Therapy  Myofascial release   Myofascial Release IASTW to R hamstrings in prone to decrease tightness in efforts to increase R knee ROM   Passive ROM --                     PT Long Term Goals - 07/30/14 0908    PT LONG TERM GOAL #1   Title Ind with HEP.   Time 6   Period Weeks   Status Achieved   PT LONG TERM GOAL #2   Title Full active right knee extension.   Time 6   Period Weeks   Status On-going   PT LONG TERM GOAL #3   Title Active right knee flexion to 115 degrees+.   Time 6   Period Weeks   Status On-going   PT LONG TERM GOAL #4   Title Right LE strength to 5/5 to increase stability for functional activites.   Time 6   Period Weeks   Status On-going   PT LONG TERM GOAL #5   Title Decrease edema to within 2 cms of contralateral side to assist with range of motion gains and decrease pain.   Time 6   Period Weeks   Status Achieved   PT LONG TERM GOAL #6   Title Perform a reciprocating stair gait with one railing  with pain not > 3/10.   Time 6   Period Weeks   Status Achieved               Plan - 08/01/14 0944    Clinical Impression Statement Patient tolerated treatment well with exericses and IASTW. Had some complaint of pain after NuStep on the lateral R knee. Tightness and adhesions were noted in the R hamstrings in prone during manual therapy. Increased tightness noted more in the inferior hamstrings before the R knee joint. Normal modlaties response noted following removal of the modalties. Patient educated on possible soreness and peticulae following IASTW from release of adhesions. Experienced feeling "frozen" following treatment but no pain.   Pt will benefit from skilled therapeutic intervention in order to improve on the following deficits Abnormal gait;Decreased strength;Pain;Decreased activity tolerance;Increased edema;Decreased range of motion;Decreased endurance   Clinical Impairments Affecting Rehab Potential surgery 03/26/14 (14 weeks 07/02/14)    PT Frequency 3x / week   PT Duration 6 weeks   PT Treatment/Interventions ADLs/Self Care Home Management;Therapeutic activities;Manual techniques;Therapeutic exercise;Cryotherapy;Electrical Stimulation;Scar mobilization   PT Next Visit Plan Continue per PT POC. Consider continuing IASTW to R posterior knee next session to decrease R hamstring tightness in effort to advance R knee ROM.   Consulted and Agree with Plan of Care Patient        Problem List There are no active problems to display for this patient.   Evelene Croon, PTA 08/01/2014, 9:59 AM  [facility] [address] [place], [place], [address] Phone: [phone]   Fax:  [phone]

## 2014-08-02 NOTE — Therapy (Signed)
[facility] [address] [place], [place], [address] Phone: [phone]   Fax:  [phone]  Physical Therapy Treatment  Patient Details  Name: Melinda Hayes MRN: [id number] Date of Birth: 29-Dec-1955 Referring Provider:  Case, Helen Hashimoto, MD  Encounter Date: 07/18/2014      PT End of Session - 08/01/14 0907    Visit Number 12   Number of Visits 18   Date for PT Re-Evaluation 08/20/14   PT Start Time 0904   PT Stop Time 0953   PT Time Calculation (min) 49 min   Activity Tolerance Patient tolerated treatment well   Behavior During Therapy [facility] for tasks assessed/performed      History reviewed. No pertinent past medical history.  History reviewed. No pertinent past surgical history.  There were no vitals filed for this visit.  Visit Diagnosis:  Right knee pain  Stiffness of right knee      Subjective Assessment - 08/01/14 0906    Subjective States that she has a little soreness but no pain. "If we could just get the swelling down. That's been since 7:30am when I got up."   Limitations Walking;House hold activities   Patient Stated Goals wants less pain and swelling   Currently in Pain? No/denies     07/18/14 Treatment consisted of treatment to rt knee. Pt had a pain level of 6/10. She had 28 min of therapeutic exercise. Nustep L4  X 15 min. She performed step ups in bars, rockerboard, marching. Pt was on mat for PROM for flexion and extension with low load holds.   She received EStim and vaspneumatic and reported relief afterwards.                                 PT Long Term Goals - 07/30/14 0908    PT LONG TERM GOAL #1   Title Ind with HEP.   Time 6   Period Weeks   Status Achieved   PT LONG TERM GOAL #2   Title Full active right knee extension.   Time 6   Period Weeks   Status On-going   PT LONG TERM GOAL #3   Title Active right knee flexion to 115 degrees+.   Time 6   Period Weeks    Status On-going   PT LONG TERM GOAL #4   Title Right LE strength to 5/5 to increase stability for functional activites.   Time 6   Period Weeks   Status On-going   PT LONG TERM GOAL #5   Title Decrease edema to within 2 cms of contralateral side to assist with range of motion gains and decrease pain.   Time 6   Period Weeks   Status Achieved   PT LONG TERM GOAL #6   Title Perform a reciprocating stair gait with one railing with pain not > 3/10.   Time 6   Period Weeks   Status Achieved               Plan - 08/01/14 0944    Clinical Impression Statement Patient tolerated treatment well with exericses and IASTW. Had some complaint of pain after NuStep on the lateral R knee. Tightness and adhesions were noted in the R hamstrings in prone during manual therapy. Increased tightness noted more in the inferior hamstrings before the R knee joint. Normal modlaties response noted following removal of the modalties. Patient educated on possible soreness and  peticulae following IASTW from release of adhesions. Experienced feeling "frozen" following treatment but no pain.   Pt will benefit from skilled therapeutic intervention in order to improve on the following deficits Abnormal gait;Decreased strength;Pain;Decreased activity tolerance;Increased edema;Decreased range of motion;Decreased endurance   Clinical Impairments Affecting Rehab Potential surgery 03/26/14 (14 weeks 07/02/14)   PT Frequency 3x / week   PT Duration 6 weeks   PT Treatment/Interventions ADLs/Self Care Home Management;Therapeutic activities;Manual techniques;Therapeutic exercise;Cryotherapy;Electrical Stimulation;Scar mobilization   PT Next Visit Plan Continue per PT POC. Consider continuing IASTW to R posterior knee next session to decrease R hamstring tightness in effort to advance R knee ROM.   Consulted and Agree with Plan of Care Patient        Problem List There are no active problems to display for this  patient.   Cristal Ford P,PTA 08/02/2014, 11:05 AM  [facility] [address] [place], [place], [address] Phone: [phone]   Fax:  [phone]

## 2014-08-06 ENCOUNTER — Encounter: Payer: Self-pay | Admitting: Physical Therapy

## 2014-08-06 ENCOUNTER — Ambulatory Visit: Payer: No Typology Code available for payment source | Admitting: Physical Therapy

## 2014-08-06 DIAGNOSIS — M25561 Pain in right knee: Secondary | ICD-10-CM

## 2014-08-06 DIAGNOSIS — M25661 Stiffness of right knee, not elsewhere classified: Secondary | ICD-10-CM

## 2014-08-06 NOTE — Therapy (Signed)
[facility] [address] [place], [place], [address] Phone: [phone]   Fax:  [phone]  Physical Therapy Treatment  Patient Details  Name: Melinda Hayes MRN: [id number] Date of Birth: [birth year] Referring Provider:  Case, Helen Hashimoto, MD  Encounter Date: 08/06/2014      PT End of Session - 08/06/14 0952    Visit Number 13   Number of Visits 18   Date for PT Re-Evaluation 08/20/14   PT Start Time 0905   PT Stop Time 1000   PT Time Calculation (min) 55 min   Activity Tolerance Patient tolerated treatment well   Behavior During Therapy [facility] for tasks assessed/performed      History reviewed. No pertinent past medical history.  History reviewed. No pertinent past surgical history.  There were no vitals filed for this visit.  Visit Diagnosis:  Right knee pain  Stiffness of right knee      Subjective Assessment - 08/06/14 0910    Subjective stiff and sore in knee today   Limitations Walking;House hold activities   Patient Stated Goals wants less pain and swelling   Currently in Pain? Yes   Pain Score 4    Pain Location Knee   Pain Orientation Right   Pain Descriptors / Indicators Sore   Pain Type Surgical pain   Pain Onset More than a month ago   Aggravating Factors  increased activity   Pain Relieving Factors rest            [facility] PT Assessment - 08/06/14 0001    ROM / Strength   AROM / PROM / Strength AROM;PROM   AROM   Overall AROM  Deficits   AROM Assessment Site Knee   Right/Left Knee Right   Right Knee Extension -10   Right Knee Flexion 90   PROM   Overall PROM  Deficits   Overall PROM Comments -5 -99   PROM Assessment Site Knee   Right/Left Knee Right                     [facility] Adult PT Treatment/Exercise - 08/06/14 0001    Knee/Hip Exercises: Stretches   Knee: Self-Stretch to increase Flexion 3 reps;30 seconds   Knee/Hip Exercises: Aerobic   Stationary Bike NuStep L5 x11min   Knee/Hip Exercises: Standing   Rocker Board 3 minutes   Manual Therapy   Manual Therapy Passive ROM   Passive ROM low load holds for flexion and extension end range then patella mobilization                     PT Long Term Goals - 07/30/14 0908    PT LONG TERM GOAL #1   Title Ind with HEP.   Time 6   Period Weeks   Status Achieved   PT LONG TERM GOAL #2   Title Full active right knee extension.   Time 6   Period Weeks   Status On-going   PT LONG TERM GOAL #3   Title Active right knee flexion to 115 degrees+.   Time 6   Period Weeks   Status On-going   PT LONG TERM GOAL #4   Title Right LE strength to 5/5 to increase stability for functional activites.   Time 6   Period Weeks   Status On-going   PT LONG TERM GOAL #5   Title Decrease edema to within 2 cms of contralateral side to assist with range of motion gains and  decrease pain.   Time 6   Period Weeks   Status Achieved   PT LONG TERM GOAL #6   Title Perform a reciprocating stair gait with one railing with pain not > 3/10.   Time 6   Period Weeks   Status Achieved               Plan - 08/06/14 0954    Clinical Impression Statement patient slowly progress with ROM for right knee flexion, and has some stiffness in patella. unable to meet any further goals today due to ROM limitations in knee. patient reported not doing stretching at home. HEP given for different ROM activities.   Pt will benefit from skilled therapeutic intervention in order to improve on the following deficits Abnormal gait;Decreased strength;Pain;Decreased activity tolerance;Increased edema;Decreased range of motion;Decreased endurance   Rehab Potential Good   Clinical Impairments Affecting Rehab Potential surgery 03/26/14 (19 weeks 08/06/14)   PT Frequency 3x / week   PT Duration 6 weeks   PT Treatment/Interventions ADLs/Self Care Home Management;Therapeutic activities;Manual techniques;Therapeutic exercise;Cryotherapy;Electrical  Stimulation;Scar mobilization   PT Next Visit Plan Continue per PT POC. Consider continuing IASTW to R posterior knee next session to decrease R hamstring tightness in effort to advance R knee ROM.   Consulted and Agree with Plan of Care Patient        Problem List There are no active problems to display for this patient.   [name], PTA 08/06/2014, 10:04 AM  [facility] [address] [place], [place], [address] Phone: [phone]   Fax:  [phone]

## 2014-08-06 NOTE — Patient Instructions (Signed)
Chair Knee Flexion   Keeping feet on floor, slide foot of operated leg back, bending knee. Hold ___30_ seconds. Repeat __5__ times. Do __2-4__ sessions a day.  Heel Slide   Bend left knee and pull heel toward buttocks. Use strap around foot and pull strap with arms to assist knee to bend further. Hold 10 secs.  Repeat ____ times. Do ____ sessions per day.  Knee Wall Slide   Slowly "walk" or slide feet on wall toward floor until stretch is felt in knees. Repeat ____ times per set. Do ____ sets per session. Do ____ sessions per day.    PRONE KNEE FLEXION STRETCH WITH BELT - ANKLE ANCHORED  Start by lying on your stomach with a strap or 2 belts linked together and looped it around your affected side ankle.  Next, use the belt to pull the knee into a bent position allowing for a stretch as shown.   stretch 30 sec x5-10 2-3 x daily     Knee Flexion Stretch on Step  Place foot on step and lean forward until you feel a good stretch in front of knee.   hold 30 sec x 5-10 perform 2-4 x daily

## 2014-08-06 NOTE — Therapy (Signed)
[facility] [address] [place], [place], [address] Phone: [phone]   Fax:  [phone]  Physical Therapy Treatment  Patient Details  Name: Melinda Hayes MRN: [id number] Date of Birth: 14-Mar-1955 Referring Provider:  Case, Helen Hashimoto, MD  Encounter Date: 08/06/2014      PT End of Session - 08/06/14 0952    Visit Number 13   Number of Visits 18   Date for PT Re-Evaluation 08/20/14   PT Start Time 0905   PT Stop Time 1000   PT Time Calculation (min) 55 min   Activity Tolerance Patient tolerated treatment well   Behavior During Therapy [facility] for tasks assessed/performed      History reviewed. No pertinent past medical history.  History reviewed. No pertinent past surgical history.  There were no vitals filed for this visit.  Visit Diagnosis:  Right knee pain  Stiffness of right knee      Subjective Assessment - 08/06/14 0910    Subjective stiff and sore in knee today   Limitations Walking;House hold activities   Patient Stated Goals wants less pain and swelling   Currently in Pain? Yes   Pain Score 4    Pain Location Knee   Pain Orientation Right   Pain Descriptors / Indicators Sore   Pain Type Surgical pain   Pain Onset More than a month ago   Aggravating Factors  increased activity   Pain Relieving Factors rest            [facility] PT Assessment - 08/06/14 0001    ROM / Strength   AROM / PROM / Strength AROM;PROM   AROM   Overall AROM  Deficits   AROM Assessment Site Knee   Right/Left Knee Right   Right Knee Extension -10   Right Knee Flexion 90   PROM   Overall PROM  Deficits   Overall PROM Comments -5 -99   PROM Assessment Site Knee   Right/Left Knee Right                     [facility] Adult PT Treatment/Exercise - 08/06/14 0001    Knee/Hip Exercises: Stretches   Knee: Self-Stretch to increase Flexion 3 reps;30 seconds   Knee/Hip Exercises: Aerobic   Stationary Bike NuStep L5 x23min   Knee/Hip Exercises: Standing   Rocker Board 3 minutes   Manual Therapy   Manual Therapy Passive ROM   Passive ROM low load holds for flexion and extension end range then patella mobilization                PT Education - 08/06/14 1010    Education provided Yes   Education Details ROM knee flexion   Person(s) Educated Patient   Methods Explanation;Demonstration;Handout   Comprehension Verbalized understanding;Returned demonstration             PT Long Term Goals - 07/30/14 0908    PT LONG TERM GOAL #1   Title Ind with HEP.   Time 6   Period Weeks   Status Achieved   PT LONG TERM GOAL #2   Title Full active right knee extension.   Time 6   Period Weeks   Status On-going   PT LONG TERM GOAL #3   Title Active right knee flexion to 115 degrees+.   Time 6   Period Weeks   Status On-going   PT LONG TERM GOAL #4   Title Right LE strength to 5/5 to increase stability for functional  activites.   Time 6   Period Weeks   Status On-going   PT LONG TERM GOAL #5   Title Decrease edema to within 2 cms of contralateral side to assist with range of motion gains and decrease pain.   Time 6   Period Weeks   Status Achieved   PT LONG TERM GOAL #6   Title Perform a reciprocating stair gait with one railing with pain not > 3/10.   Time 6   Period Weeks   Status Achieved               Plan - 08/06/14 0954    Clinical Impression Statement patient slowly progress with ROM for right knee flexion, and has some stiffness in patella. unable to meet any further goals today due to ROM limitations in knee. patient reported not doing stretching at home. HEP given for different ROM activities.   Pt will benefit from skilled therapeutic intervention in order to improve on the following deficits Abnormal gait;Decreased strength;Pain;Decreased activity tolerance;Increased edema;Decreased range of motion;Decreased endurance   Rehab Potential Good   Clinical Impairments Affecting  Rehab Potential surgery 03/26/14 (19 weeks 08/06/14)   PT Frequency 3x / week   PT Duration 6 weeks   PT Treatment/Interventions ADLs/Self Care Home Management;Therapeutic activities;Manual techniques;Therapeutic exercise;Cryotherapy;Electrical Stimulation;Scar mobilization   PT Next Visit Plan Continue per PT POC. Consider continuing IASTW to R posterior knee next session to decrease R hamstring tightness in effort to advance R knee ROM.   Consulted and Agree with Plan of Care Patient        Problem List There are no active problems to display for this patient.   Melinda Hayes 08/06/2014, 10:10 AM  [facility] [address] [place], [place], [address] Phone: [phone]   Fax:  [phone]

## 2014-08-08 ENCOUNTER — Encounter: Payer: Self-pay | Admitting: Physical Therapy

## 2014-08-08 ENCOUNTER — Ambulatory Visit: Payer: No Typology Code available for payment source | Admitting: Physical Therapy

## 2014-08-08 DIAGNOSIS — M25561 Pain in right knee: Secondary | ICD-10-CM | POA: Diagnosis not present

## 2014-08-08 DIAGNOSIS — M25661 Stiffness of right knee, not elsewhere classified: Secondary | ICD-10-CM

## 2014-08-08 NOTE — Therapy (Signed)
[facility] [address] [place], [place], [address] Phone: [phone]   Fax:  [phone]  Physical Therapy Treatment  Patient Details  Name: Melinda Hayes MRN: [id number] Date of Birth: [birth year] Referring Provider:  Case, Helen Hashimoto, MD  Encounter Date: 08/08/2014      PT End of Session - 08/08/14 0949    Visit Number 14   Number of Visits 18   Date for PT Re-Evaluation 08/20/14   PT Start Time 0908   PT Stop Time 1003   PT Time Calculation (min) 55 min   Activity Tolerance Patient tolerated treatment well   Behavior During Therapy [facility] for tasks assessed/performed      History reviewed. No pertinent past medical history.  History reviewed. No pertinent past surgical history.  There were no vitals filed for this visit.  Visit Diagnosis:  Right knee pain  Stiffness of right knee      Subjective Assessment - 08/08/14 0911    Subjective stiff and sore in knee today   Limitations Walking;House hold activities   Patient Stated Goals wants less pain and swelling   Currently in Pain? Yes   Pain Score 5    Pain Location Knee   Pain Orientation Right   Pain Descriptors / Indicators Sore   Pain Type Surgical pain   Pain Onset More than a month ago   Pain Frequency Constant   Aggravating Factors  increased activity or ROM in knee   Pain Relieving Factors rest            [facility] PT Assessment - 08/08/14 0001    AROM   Overall AROM  Deficits   AROM Assessment Site Knee   Right/Left Knee Right   Right Knee Extension -10   Right Knee Flexion 85   PROM   Overall PROM  Deficits   Overall PROM Comments -5-94   PROM Assessment Site Knee   Right/Left Knee Right                     [facility] Adult PT Treatment/Exercise - 08/08/14 0001    Knee/Hip Exercises: Stretches   Knee: Self-Stretch to increase Flexion 3 reps;30 seconds   Knee/Hip Exercises: Aerobic   Stationary Bike NuStep L5 x31min   Knee/Hip  Exercises: Standing   Forward Step Up Right;3 sets;10 reps;Step Height: 6"   Rocker Board 3 minutes   Cryotherapy   Number Minutes Cryotherapy 15 Minutes   Cryotherapy Location Knee   Type of Cryotherapy --  vasopnumatic   Electrical Stimulation   Electrical Stimulation Location R knee   Electrical Stimulation Action IFC   Electrical Stimulation Parameters 1-10HZ    Electrical Stimulation Goals Edema;Pain   Manual Therapy   Manual Therapy Passive ROM   Passive ROM low load holds for flexion and extension end range then patella mobilization                     PT Long Term Goals - 07/30/14 0908    PT LONG TERM GOAL #1   Title Ind with HEP.   Time 6   Period Weeks   Status Achieved   PT LONG TERM GOAL #2   Title Full active right knee extension.   Time 6   Period Weeks   Status On-going   PT LONG TERM GOAL #3   Title Active right knee flexion to 115 degrees+.   Time 6   Period Weeks   Status On-going  PT LONG TERM GOAL #4   Title Right LE strength to 5/5 to increase stability for functional activites.   Time 6   Period Weeks   Status On-going   PT LONG TERM GOAL #5   Title Decrease edema to within 2 cms of contralateral side to assist with range of motion gains and decrease pain.   Time 6   Period Weeks   Status Achieved   PT LONG TERM GOAL #6   Title Perform a reciprocating stair gait with one railing with pain not > 3/10.   Time 6   Period Weeks   Status Achieved               Plan - 08/08/14 0952    Pt will benefit from skilled therapeutic intervention in order to improve on the following deficits Abnormal gait;Decreased strength;Pain;Decreased activity tolerance;Increased edema;Decreased range of motion;Decreased endurance   Rehab Potential Good   Clinical Impairments Affecting Rehab Potential surgery 03/26/14 (19 weeks 08/06/14)   PT Frequency 3x / week   PT Duration 6 weeks   PT Treatment/Interventions ADLs/Self Care Home  Management;Therapeutic activities;Manual techniques;Therapeutic exercise;Cryotherapy;Electrical Stimulation;Scar mobilization   PT Next Visit Plan Continue per PT POC patient to call MD regarding ROM per MPT   Consulted and Agree with Plan of Care Patient        Problem List There are no active problems to display for this patient.   [name], PTA 08/08/2014, 10:22 AM  [facility] [address] [place], [place], [address] Phone: [phone]   Fax:  [phone]

## 2014-08-13 ENCOUNTER — Ambulatory Visit: Payer: No Typology Code available for payment source | Admitting: Physical Therapy

## 2014-08-13 DIAGNOSIS — M25561 Pain in right knee: Secondary | ICD-10-CM | POA: Diagnosis not present

## 2014-08-13 DIAGNOSIS — M25661 Stiffness of right knee, not elsewhere classified: Secondary | ICD-10-CM

## 2014-08-13 NOTE — Therapy (Signed)
[facility] [address] [place], [place], [address] Phone: [phone]   Fax:  [phone]  Physical Therapy Treatment  Patient Details  Name: Melinda Hayes MRN: [id number] Date of Birth: 15-Jan-1956 Referring Provider:  Case, Helen Hashimoto, MD  Encounter Date: 08/13/2014      PT End of Session - 08/13/14 0907    Visit Number 15   Number of Visits 18   Date for PT Re-Evaluation 08/20/14   PT Start Time 0902   PT Stop Time 0955   PT Time Calculation (min) 53 min   Activity Tolerance Patient tolerated treatment well   Behavior During Therapy [facility] for tasks assessed/performed      No past medical history on file.  No past surgical history on file.  There were no vitals filed for this visit.  Visit Diagnosis:  Right knee pain  Stiffness of right knee      Subjective Assessment - 08/13/14 0904    Subjective Reports soreness and stiffness today. Has not heard anything from the [place] regarding extended therapy. Says that Dr. Case would like something from therapy for extended visits. Reports HEP compliance but reports no improvements.   Limitations Walking;House hold activities   Patient Stated Goals wants less pain and swelling   Currently in Pain? Yes   Pain Score 4    Pain Location Knee   Pain Orientation Right   Pain Descriptors / Indicators Sore;Other (Comment)  Stiffness   Pain Type Surgical pain   Pain Onset More than a month ago   Pain Frequency Constant            [facility] PT Assessment - 08/13/14 0001    Assessment   Medical Diagnosis Right total knee replacement.   Onset Date/Surgical Date 03/26/14   Next MD Visit 01/2015   ROM / Strength   AROM / PROM / Strength AROM   AROM   Overall AROM  Deficits   AROM Assessment Site Knee   Right/Left Knee Right   Right Knee Extension 0   Right Knee Flexion 95                     [facility] Adult PT Treatment/Exercise - 08/13/14 0001    Knee/Hip Exercises:  Stretches   Knee: Self-Stretch to increase Flexion 3 reps;30 seconds   Knee/Hip Exercises: Aerobic   Stationary Bike NuStep L4 x   Knee/Hip Exercises: Standing   Lateral Step Up Right;2 sets;10 reps;Step Height: 6"   Forward Step Up Right;3 sets;10 reps;Step Height: 6"   Rocker Board 3 minutes   Modalities   Modalities Cryotherapy;Electrical Stimulation   Cryotherapy   Number Minutes Cryotherapy 15 Minutes   Cryotherapy Location Knee   Type of Cryotherapy Other (comment)  Medium Vasopnuematic   Electrical Stimulation   Electrical Stimulation Location R knee   Electrical Stimulation Action IFC   Electrical Stimulation Parameters 1-10 Hz x15 min   Electrical Stimulation Goals Edema;Pain   Manual Therapy   Manual Therapy Passive ROM;Soft tissue mobilization   Soft tissue mobilization R patellar mobilizations into L/R, sup/inf, tilts   Passive ROM R knee into flexion/extension with gentle holds at end range                     PT Long Term Goals - 08/13/14 0940    PT LONG TERM GOAL #1   Title Ind with HEP.   Time 6   Period Weeks   Status Achieved  PT LONG TERM GOAL #2   Title Full active right knee extension.   Time 6   Period Weeks   Status Achieved   PT LONG TERM GOAL #3   Title Active right knee flexion to 115 degrees+.   Time 6   Period Weeks   Status On-going   PT LONG TERM GOAL #4   Title Right LE strength to 5/5 to increase stability for functional activites.   Time 6   Period Weeks   Status On-going   PT LONG TERM GOAL #5   Title Decrease edema to within 2 cms of contralateral side to assist with range of motion gains and decrease pain.   Time 6   Period Weeks   Status Achieved   PT LONG TERM GOAL #6   Title Perform a reciprocating stair gait with one railing with pain not > 3/10.   Time 6   Period Weeks   Status Achieved               Plan - 08/13/14 0941    Clinical Impression Statement Patient is progressing slowly in  physical therapy regarding R knee ROM. Has achieved full R knee extension as of today's appointment. Other remaining goals are considered on-going secondary to decreased R knee flexion, R knee strength. Diminished R patellar mobility especially in sup/inf motions. Normal modalities response noted following removal of the modalities. Experienced 1/10 pain following treatment.   Pt will benefit from skilled therapeutic intervention in order to improve on the following deficits Abnormal gait;Decreased strength;Pain;Decreased activity tolerance;Increased edema;Decreased range of motion;Decreased endurance   Rehab Potential Good   Clinical Impairments Affecting Rehab Potential surgery 03/26/14 (20 weeks 08/13/14)   PT Frequency 3x / week   PT Duration 6 weeks   PT Treatment/Interventions ADLs/Self Care Home Management;Therapeutic activities;Manual techniques;Therapeutic exercise;Cryotherapy;Electrical Stimulation;Scar mobilization   PT Next Visit Plan Continue per PT POC patient to call MD regarding ROM per MPT. Route note to Dr. Case.   Consulted and Agree with Plan of Care Patient        Problem List There are no active problems to display for this patient.   Florence Canner, PTA 08/13/2014 9:59 AM   [facility] [address] [place], [place], [address] Phone: [phone]   Fax:  [phone]

## 2014-08-15 ENCOUNTER — Ambulatory Visit: Payer: No Typology Code available for payment source | Admitting: Physical Therapy

## 2014-08-15 DIAGNOSIS — M25561 Pain in right knee: Secondary | ICD-10-CM

## 2014-08-15 DIAGNOSIS — M25661 Stiffness of right knee, not elsewhere classified: Secondary | ICD-10-CM

## 2014-08-15 NOTE — Therapy (Signed)
[facility] [address] [place], [place], [address] Phone: [phone]   Fax:  [phone]  Physical Therapy Treatment  Patient Details  Name: Melinda Hayes MRN: [id number] Date of Birth: February 06, 1956 Referring Provider:  Case, Helen Hashimoto, MD  Encounter Date: 08/15/2014      PT End of Session - 08/15/14 0901    Visit Number 16   Number of Visits 18   Date for PT Re-Evaluation 08/20/14   PT Start Time 0900   PT Stop Time 0950   PT Time Calculation (min) 50 min   Activity Tolerance Patient tolerated treatment well   Behavior During Therapy [facility] for tasks assessed/performed      No past medical history on file.  No past surgical history on file.  There were no vitals filed for this visit.  Visit Diagnosis:  Right knee pain  Stiffness of right knee      Subjective Assessment - 08/15/14 0902    Subjective Has been up since 6 am setting up tents for community camp out. States that she will be camping on a cot and not on the ground this weekend. Reports knee is a little swollen from the activity this morning.   Limitations Walking;House hold activities   Patient Stated Goals wants less pain and swelling   Currently in Pain? No/denies            [facility] PT Assessment - 08/15/14 0001    Assessment   Medical Diagnosis Right total knee replacement.   Onset Date/Surgical Date 03/26/14   Next MD Visit 01/2015   AROM   Overall AROM  Deficits   AROM Assessment Site Knee   Right/Left Knee Right   Right Knee Extension 0   Right Knee Flexion 98                     [facility] Adult PT Treatment/Exercise - 08/15/14 0001    Knee/Hip Exercises: Stretches   Knee: Self-Stretch to increase Flexion 3 reps;30 seconds   Knee/Hip Exercises: Aerobic   Stationary Bike NuStep L6 x8 min   Knee/Hip Exercises: Standing   Lateral Step Up Right;2 sets;10 reps;Step Height: 6"   Forward Step Up Right;3 sets;10 reps;Step Height: 6"   Rocker Board 3 minutes   Modalities   Modalities Cryotherapy;Electrical Stimulation   Cryotherapy   Number Minutes Cryotherapy 15 Minutes   Cryotherapy Location Knee   Type of Cryotherapy Other (comment)  Med. vasopneumatic   Programme researcher, broadcasting/film/video Location R knee   Electrical Stimulation Action IFC   Electrical Stimulation Parameters 1-10 Hz x15 min   Electrical Stimulation Goals Edema;Pain   Manual Therapy   Manual Therapy Passive ROM   Soft tissue mobilization --   Passive ROM R knee into flexion/extension with gentle holds at end range                     PT Long Term Goals - 08/15/14 0934    PT LONG TERM GOAL #1   Title Ind with HEP.   Time 6   Period Weeks   Status Achieved   PT LONG TERM GOAL #2   Title Full active right knee extension.   Time 6   Period Weeks   Status Achieved   PT LONG TERM GOAL #3   Title Active right knee flexion to 115 degrees+.   Time 6   Period Weeks   Status On-going   PT LONG TERM GOAL #  4   Title Right LE strength to 5/5 to increase stability for functional activites.   Time 6   Period Weeks   Status On-going   PT LONG TERM GOAL #5   Title Decrease edema to within 2 cms of contralateral side to assist with range of motion gains and decrease pain.   Time 6   Period Weeks   Status Achieved   PT LONG TERM GOAL #6   Title Perform a reciprocating stair gait with one railing with pain not > 3/10.   Time 6   Period Weeks   Status Achieved               Plan - 08/15/14 0934    Clinical Impression Statement Patient is progressing slowly in PT regarding R knee ROM. AROM R knee measured 0-98 deg today. Others remaining goals are considered on-going secondary to decreased R knee flexion, and strength. Normal modalites response noted following removal of the modalties. Denied pain followiing treatment.   Pt will benefit from skilled therapeutic intervention in order to improve on the following  deficits Abnormal gait;Decreased strength;Pain;Decreased activity tolerance;Increased edema;Decreased range of motion;Decreased endurance   Rehab Potential Good   Clinical Impairments Affecting Rehab Potential surgery 03/26/14 (20 weeks 08/13/14)   PT Frequency 3x / week   PT Duration 6 weeks   PT Treatment/Interventions ADLs/Self Care Home Management;Therapeutic activities;Manual techniques;Therapeutic exercise;Cryotherapy;Electrical Stimulation;Scar mobilization   PT Next Visit Plan Continue per PT POC patient to call MD regarding ROM per MPT. Route note to Dr. Case.   Consulted and Agree with Plan of Care Patient        Problem List There are no active problems to display for this patient.   Florence Canner, PTA 08/15/2014 10:36 AM   [facility] [address] [place], [place], [address] Phone: [phone]   Fax:  [phone]

## 2014-08-20 ENCOUNTER — Ambulatory Visit: Payer: No Typology Code available for payment source | Admitting: Physical Therapy

## 2014-08-20 DIAGNOSIS — M25661 Stiffness of right knee, not elsewhere classified: Secondary | ICD-10-CM

## 2014-08-20 DIAGNOSIS — M25561 Pain in right knee: Secondary | ICD-10-CM | POA: Diagnosis not present

## 2014-08-20 NOTE — Therapy (Signed)
[facility] [address] [place], [place], [address] Phone: [phone]   Fax:  [phone]  Physical Therapy Treatment  Patient Details  Name: Deliliah Spranger MRN: [id number] Date of Birth: 27-Apr-1955 Referring Provider:  Case, Helen Hashimoto, MD  Encounter Date: 08/20/2014      PT End of Session - 08/20/14 0906    Visit Number 17   Number of Visits 18   Date for PT Re-Evaluation 08/20/14   PT Start Time 0905   PT Stop Time 0954   PT Time Calculation (min) 49 min   Activity Tolerance Patient tolerated treatment well   Behavior During Therapy [facility] for tasks assessed/performed      No past medical history on file.  No past surgical history on file.  There were no vitals filed for this visit.  Visit Diagnosis:  Right knee pain  Stiffness of right knee      Subjective Assessment - 08/20/14 0906    Subjective States that knee did okay while camping over the weekend and had to be careful with uneven ground. Denies pain only sitffness.   Limitations Walking;House hold activities   Patient Stated Goals wants less pain and swelling   Currently in Pain? No/denies            [facility] PT Assessment - 08/20/14 0001    Assessment   Medical Diagnosis Right total knee replacement.   Onset Date/Surgical Date 03/26/14   Next MD Visit 01/2015   ROM / Strength   AROM / PROM / Strength AROM   AROM   Overall AROM  Deficits   AROM Assessment Site Knee   Right/Left Knee Right   Right Knee Extension 0   Right Knee Flexion 94                     [facility] Adult PT Treatment/Exercise - 08/20/14 0001    Knee/Hip Exercises: Stretches   Knee: Self-Stretch to increase Flexion --   Knee/Hip Exercises: Aerobic   Stationary Bike NuStep L4 x8 min   Knee/Hip Exercises: Standing   Forward Lunges Right;3 sets;10 reps;3 seconds;Other (comment)  on 14" step   Forward Step Up Right;3 sets;10 reps;Step Height: 6"   Rocker Board 3 minutes   Modalities   Modalities Cryotherapy;Electrical Stimulation   Cryotherapy   Number Minutes Cryotherapy 15 Minutes   Cryotherapy Location Knee   Type of Cryotherapy Ice pack   Electrical Stimulation   Electrical Stimulation Location R knee   Electrical Stimulation Action IFC   Electrical Stimulation Parameters 1-10 Hz x15 min   Electrical Stimulation Goals Edema;Pain   Manual Therapy   Manual Therapy Passive ROM   Passive ROM R knee into flexion/extension with gentle holds at end range                     PT Long Term Goals - 08/20/14 0940    PT LONG TERM GOAL #1   Title Ind with HEP.   Time 6   Period Weeks   Status Achieved   PT LONG TERM GOAL #2   Title Full active right knee extension.   Time 6   Period Weeks   Status Achieved   PT LONG TERM GOAL #3   Title Active right knee flexion to 115 degrees+.   Time 6   Period Weeks   Status On-going   PT LONG TERM GOAL #4   Title Right LE strength to 5/5 to increase stability for  functional activites.   Time 6   Period Weeks   Status On-going   PT LONG TERM GOAL #5   Title Decrease edema to within 2 cms of contralateral side to assist with range of motion gains and decrease pain.   Time 6   Period Weeks   Status Achieved   PT LONG TERM GOAL #6   Title Perform a reciprocating stair gait with one railing with pain not > 3/10.   Time 6   Period Weeks   Status Achieved               Plan - 08/20/14 0941    Clinical Impression Statement Patient is progresing slowly with exercises and ROM. AROM flexion decreased to 94 deg from 98 deg today in clinic. Other remaining goals are considered on-going secondary to decreased R knee flexion and decreased R knee strength. Normal modalities response observed following removal of modalties. Denied pain following treatment.   Pt will benefit from skilled therapeutic intervention in order to improve on the following deficits Abnormal gait;Decreased  strength;Pain;Decreased activity tolerance;Increased edema;Decreased range of motion;Decreased endurance   Clinical Impairments Affecting Rehab Potential surgery 03/26/14 (20 weeks 08/13/14)   PT Frequency 3x / week   PT Duration 6 weeks   PT Treatment/Interventions ADLs/Self Care Home Management;Therapeutic activities;Manual techniques;Therapeutic exercise;Cryotherapy;Electrical Stimulation;Scar mobilization   PT Next Visit Plan Continue per PT POC patient to call MD regarding ROM per MPT. Route note to Dr. Case to send to [facility] for approval of further visits.   Consulted and Agree with Plan of Care Patient        Problem List There are no active problems to display for this patient.  Florence Canner, PTA 08/20/2014 9:58 AM   [facility] [address] [place], [place], [address] Phone: [phone]   Fax:  [phone]

## 2014-08-22 ENCOUNTER — Ambulatory Visit: Payer: No Typology Code available for payment source | Admitting: Physical Therapy

## 2014-08-22 DIAGNOSIS — M25561 Pain in right knee: Secondary | ICD-10-CM

## 2014-08-22 DIAGNOSIS — M25661 Stiffness of right knee, not elsewhere classified: Secondary | ICD-10-CM

## 2014-08-22 NOTE — Therapy (Addendum)
[facility] [place], [place], [address] Phone: [phone]   Fax:  [phone]  Physical Therapy Treatment  Patient Details  Name: Melinda Hayes MRN: [id number] Date of Birth: 10/15/1955 Referring Provider:  Case, Reche Dixon, MD  Encounter Date: 08/22/2014      PT End of Session - 08/22/14 0904    Visit Number 18   Number of Visits 18   Date for PT Re-Evaluation 08/20/14   PT Start Time 1696   PT Stop Time 0951   PT Time Calculation (min) 54 min   Equipment Utilized During Treatment Other (comment)  SPC   Activity Tolerance Patient tolerated treatment well   Behavior During Therapy [facility] for tasks assessed/performed      No past medical history on file.  No past surgical history on file.  There were no vitals filed for this visit.  Visit Diagnosis:  Right knee pain  Stiffness of right knee      Subjective Assessment - 08/22/14 0905    Subjective States that yesterday her R knee was hurting bad but not hurting today like it was yesterday.   Limitations Walking;House hold activities   Patient Stated Goals wants less pain and swelling   Currently in Pain? Yes   Pain Score 3    Pain Location Knee   Pain Orientation Right   Pain Descriptors / Indicators Sore   Pain Type Surgical pain   Pain Onset More than a month ago            [facility] PT Assessment - 08/22/14 0001    Assessment   Medical Diagnosis Right total knee replacement.   Onset Date/Surgical Date 03/26/14   Next MD Visit 01/2015   ROM / Strength   AROM / PROM / Strength AROM;Strength   AROM   Overall AROM  Deficits   AROM Assessment Site Knee   Right/Left Knee Right   Right Knee Extension 0   Right Knee Flexion 98   Strength   Overall Strength Deficits   Strength Assessment Site Knee   Right/Left Knee Right   Right Knee Flexion 3+/5   Right Knee Extension 4/5                     [facility] Adult PT Treatment/Exercise -  08/22/14 0001    Exercises   Exercises Knee/Hip   Knee/Hip Exercises: Aerobic   Nustep L4 x15 min   Knee/Hip Exercises: Standing   Forward Lunges Right;3 sets;10 reps;3 seconds;Other (comment)  on 14" step   Forward Step Up Right;3 sets;10 reps;Step Height: 6"   Rocker Board 3 minutes   Modalities   Modalities Cryotherapy;Electrical Stimulation   Cryotherapy   Number Minutes Cryotherapy 15 Minutes   Cryotherapy Location Knee   Type of Cryotherapy Other (comment)  Vasopneumatic   Acupuncturist Location R knee   Electrical Stimulation Action IFC   Electrical Stimulation Parameters 1-10 Hz x15 min   Electrical Stimulation Goals Edema;Pain   Manual Therapy   Manual Therapy Passive ROM   Passive ROM R knee into flexion/extension with gentle holds at end range                     PT Long Term Goals - 08/22/14 7893    PT LONG TERM GOAL #1   Title Ind with HEP.   Time 6   Period Weeks   Status Achieved   PT LONG TERM GOAL #  2   Title Full active right knee extension.   Time 6   Period Weeks   Status Achieved   PT LONG TERM GOAL #3   Title Active right knee flexion to 115 degrees+.   Time 6   Period Weeks   Status Not Met  R knee AROM 98 deg   PT LONG TERM GOAL #4   Title Right LE strength to 5/5 to increase stability for functional activites.   Time 6   Period Weeks   Status Not Met  R knee MMT flex 3+/5, ext 4/5   PT LONG TERM GOAL #5   Title Decrease edema to within 2 cms of contralateral side to assist with range of motion gains and decrease pain.   Time 6   Period Weeks   Status Achieved   PT LONG TERM GOAL #6   Title Perform a reciprocating stair gait with one railing with pain not > 3/10.   Time 6   Period Weeks   Status Achieved               Plan - 08/22/14 4103    Clinical Impression Statement Patient has progressed slowly regarding exercises and ROM. Has achieved all goals set at evaluation except  for R knee flexion and R knee strength. AROM R knee 0-98 deg. R knee MMT flex 3+/5, ext 4/5. Normal modalities response noted following removal of the modalities. Experienced 1-2/10 pain following treatment.   Pt will benefit from skilled therapeutic intervention in order to improve on the following deficits Abnormal gait;Decreased strength;Pain;Decreased activity tolerance;Increased edema;Decreased range of motion;Decreased endurance   Rehab Potential Good   Clinical Impairments Affecting Rehab Potential surgery 03/26/14 (20 weeks 08/13/14)   PT Frequency 3x / week   PT Duration 6 weeks   PT Treatment/Interventions ADLs/Self Care Home Management;Therapeutic activities;Manual techniques;Therapeutic exercise;Cryotherapy;Electrical Stimulation;Scar mobilization   PT Next Visit Plan Communicate with MPT regarding 18/18 visits and route note to Dr. Case regarding re-authorization.   Consulted and Agree with Plan of Care Patient        Problem List There are no active problems to display for this patient.   Wynelle Fanny, PTA 08/22/2014, 9:57 AM  [facility] [address] [place], [place], [address] Phone: [phone]   Fax:  [phone]  PHYSICAL THERAPY DISCHARGE SUMMARY  Visits from Start of Care: 18.  Current functional level related to goals / functional outcomes: Please see above.   Remaining deficits: Continued loss of right knee ROM and strength.   Education / Equipment: HEP. Plan: Patient agrees to discharge.  Patient goals were partially met. Patient is being discharged due to being pleased with the current functional level.  ?????          Mali Applegate MPT

## 2015-02-26 NOTE — Progress Notes (Unsigned)
B/P" 162/111  Pulse:88 taken by Hedda Slade RN

## 2017-03-01 DIAGNOSIS — M069 Rheumatoid arthritis, unspecified: Secondary | ICD-10-CM

## 2017-03-01 HISTORY — DX: Rheumatoid arthritis, unspecified: M06.9

## 2017-10-04 ENCOUNTER — Ambulatory Visit: Payer: Self-pay | Admitting: Podiatry

## 2017-10-05 ENCOUNTER — Ambulatory Visit: Payer: Non-veteran care | Admitting: Podiatry

## 2017-10-17 ENCOUNTER — Ambulatory Visit: Payer: Self-pay

## 2017-10-17 ENCOUNTER — Ambulatory Visit (INDEPENDENT_AMBULATORY_CARE_PROVIDER_SITE_OTHER): Payer: Medicare Other | Admitting: Podiatry

## 2017-10-17 DIAGNOSIS — M779 Enthesopathy, unspecified: Principal | ICD-10-CM

## 2017-10-17 DIAGNOSIS — M7662 Achilles tendinitis, left leg: Secondary | ICD-10-CM | POA: Diagnosis not present

## 2017-10-17 DIAGNOSIS — M722 Plantar fascial fibromatosis: Secondary | ICD-10-CM

## 2017-10-17 DIAGNOSIS — M778 Other enthesopathies, not elsewhere classified: Secondary | ICD-10-CM

## 2017-10-17 DIAGNOSIS — M7661 Achilles tendinitis, right leg: Secondary | ICD-10-CM | POA: Diagnosis not present

## 2017-10-17 MED ORDER — METHYLPREDNISOLONE 4 MG PO TBPK: ORAL_TABLET | ORAL | 0 refills | Status: DC

## 2017-10-18 ENCOUNTER — Other Ambulatory Visit (HOSPITAL_COMMUNITY): Payer: Self-pay | Admitting: Pulmonary Disease

## 2017-10-18 DIAGNOSIS — J8410 Pulmonary fibrosis, unspecified: Secondary | ICD-10-CM

## 2017-10-19 NOTE — Progress Notes (Signed)
   HPI: 62 year old female presenting today as a new patient referred by the [place] with a chief complaint of bilateral plantar fasciitis that began three months ago. She states the pain is worse in the morning when she first gets out of bed. She has been taking Meloxicam for treatment. Patient is here for further evaluation and treatment.   No past medical history on file.    Physical Exam: General: The patient is alert and oriented x3 in no acute distress.  Dermatology: Skin is warm, dry and supple bilateral lower extremities. Negative for open lesions or macerations.  Vascular: Palpable pedal pulses bilaterally. No edema or erythema noted. Capillary refill within normal limits.  Neurological: Epicritic and protective threshold grossly intact bilaterally.   Musculoskeletal Exam: Pain on palpation noted to the posterior tubercle of the bilateral calcaneus at the insertion of the Achilles tendon consistent with retrocalcaneal bursitis. Tenderness to palpation to the plantar aspect of the bilateral heels along the plantar fascia. Range of motion within normal limits. Muscle strength 5/5 in all muscle groups bilateral lower extremities.  Assessment: 1. Insertional Achilles tendinitis bilateral  2. Retrocalcaneal bursitis 3. Plantar fasciitis bilateral    Plan of Care:  1. Patient was evaluated. Radiographs were reviewed today. 2. Prescription for Medrol Dose Pak provided to patient. Then resume taking Meloxicam. 3. Plantar fascial braces dispensed bilaterally.  4. Night splint dispensed.  5. Return to clinic in 4 weeks.     Felecia Shelling, DPM [facility]  Dr. Felecia Shelling, DPM    [address]                                        [place], [place] [address]                Office [phone]  Fax [phone]

## 2017-10-25 DIAGNOSIS — M545 Low back pain, unspecified: Secondary | ICD-10-CM | POA: Insufficient documentation

## 2017-10-25 DIAGNOSIS — G8929 Other chronic pain: Secondary | ICD-10-CM | POA: Insufficient documentation

## 2017-10-25 DIAGNOSIS — M5412 Radiculopathy, cervical region: Secondary | ICD-10-CM | POA: Insufficient documentation

## 2017-11-04 ENCOUNTER — Ambulatory Visit (HOSPITAL_COMMUNITY)
Admission: RE | Admit: 2017-11-04 | Discharge: 2017-11-04 | Disposition: A | Discharge status: Home / Self Care | Payer: No Typology Code available for payment source | Source: Ambulatory Visit | Attending: Pulmonary Disease | Admitting: Pulmonary Disease

## 2017-11-04 DIAGNOSIS — I700 Atherosclerosis of aorta: Secondary | ICD-10-CM | POA: Insufficient documentation

## 2017-11-04 DIAGNOSIS — J8410 Pulmonary fibrosis, unspecified: Secondary | ICD-10-CM | POA: Diagnosis not present

## 2017-11-04 DIAGNOSIS — R918 Other nonspecific abnormal finding of lung field: Secondary | ICD-10-CM | POA: Diagnosis not present

## 2017-11-16 ENCOUNTER — Encounter: Payer: Self-pay | Admitting: Podiatry

## 2017-11-16 ENCOUNTER — Ambulatory Visit (INDEPENDENT_AMBULATORY_CARE_PROVIDER_SITE_OTHER): Payer: Non-veteran care | Admitting: Podiatry

## 2017-11-16 ENCOUNTER — Other Ambulatory Visit: Payer: Self-pay

## 2017-11-16 DIAGNOSIS — M722 Plantar fascial fibromatosis: Secondary | ICD-10-CM

## 2017-11-16 DIAGNOSIS — M7661 Achilles tendinitis, right leg: Secondary | ICD-10-CM | POA: Diagnosis not present

## 2017-11-16 DIAGNOSIS — M7662 Achilles tendinitis, left leg: Secondary | ICD-10-CM | POA: Diagnosis not present

## 2017-11-16 MED ORDER — MELOXICAM 15 MG PO TABS: 15.0000 mg | ORAL_TABLET | Freq: Every day | ORAL | 1 refills | Status: AC

## 2017-11-18 NOTE — Progress Notes (Signed)
   HPI: 62 year old female presenting today for follow up evaluation of bilateral achilles tendinitis and plantar fasciitis. She states the pain improved some after her last appointment. She has been using the plantar fascial braces and taking the Meloxicam as directed. Patient is here for further evaluation and treatment.    No past medical history on file.    Physical Exam: General: The patient is alert and oriented x3 in no acute distress.  Dermatology: Skin is warm, dry and supple bilateral lower extremities. Negative for open lesions or macerations.  Vascular: Palpable pedal pulses bilaterally. No edema or erythema noted. Capillary refill within normal limits.  Neurological: Epicritic and protective threshold grossly intact bilaterally.   Musculoskeletal Exam: Pain on palpation noted to the posterior tubercle of the bilateral calcaneus at the insertion of the Achilles tendon consistent with retrocalcaneal bursitis. Tenderness to palpation to the plantar aspect of the bilateral heels along the plantar fascia. Range of motion within normal limits. Muscle strength 5/5 in all muscle groups bilateral lower extremities.  Assessment: 1. Insertional Achilles tendinitis bilateral  2. Retrocalcaneal bursitis 3. Plantar fasciitis bilateral    Plan of Care:  1. Patient was evaluated.  2. Injection of 0.5 mLs of Celestone Soluspan injected into the retrocalcaneal bursa of the right lower extremity. Care was taken to avoid direct injection into the tendon. 3. Injection of 0.5 mLs Celestone Soluspan injected into the left heel.  4. Continue using plantar fascial braces bilaterally.  5. Refill prescription for Meloxicam provided to patient.  6. Return to clinic in 4 weeks.     Felecia Shelling, DPM [facility]  Dr. Felecia Shelling, DPM    [address]                                        [place], [place] [address]                Office [phone]  Fax [phone]

## 2017-12-14 ENCOUNTER — Ambulatory Visit: Payer: Non-veteran care | Admitting: Podiatry

## 2017-12-26 ENCOUNTER — Encounter: Payer: Self-pay | Admitting: Podiatry

## 2017-12-26 ENCOUNTER — Ambulatory Visit (INDEPENDENT_AMBULATORY_CARE_PROVIDER_SITE_OTHER): Payer: Medicare Other | Admitting: Podiatry

## 2017-12-26 DIAGNOSIS — M722 Plantar fascial fibromatosis: Secondary | ICD-10-CM

## 2017-12-26 DIAGNOSIS — M7662 Achilles tendinitis, left leg: Secondary | ICD-10-CM | POA: Diagnosis not present

## 2017-12-26 DIAGNOSIS — M7661 Achilles tendinitis, right leg: Secondary | ICD-10-CM | POA: Diagnosis not present

## 2017-12-27 NOTE — Progress Notes (Signed)
   HPI: 62 year old female presenting today for follow up evaluation of bilateral achilles tendinitis and plantar fasciitis. She states her pain has improved. She reports some mild pain when walking and standing for long periods of time. She has been using the plantar fascial braces as directed. Patient is here for further evaluation and treatment.    No past medical history on file.    Objective: Physical Exam General: The patient is alert and oriented x3 in no acute distress.  Dermatology: Skin is cool, dry and supple bilateral lower extremities. Negative for open lesions or macerations.  Vascular: Palpable pedal pulses bilaterally. No edema or erythema noted. Capillary refill within normal limits.  Neurological: Epicritic and protective threshold grossly intact bilaterally.   Musculoskeletal Exam: All pedal and ankle joints range of motion within normal limits bilateral. Muscle strength 5/5 in all groups bilateral.    Assessment: 1. Insertional Achilles tendinitis bilateral - resolved 2. Retrocalcaneal bursitis - resolved 3. Plantar fasciitis bilateral - resolved    Plan of Care:  1. Patient was evaluated.  2. Continue using plantar fascial braces bilaterally.  3. Continue using Meloxicam as needed.  4. Recommended good shoe gear.  5. Return to clinic as needed.     Felecia Shelling, DPM [facility]  Dr. Felecia Shelling, DPM    [address]                                        [place], [place] [address]                Office [phone]  Fax [phone]

## 2018-01-31 ENCOUNTER — Other Ambulatory Visit (HOSPITAL_COMMUNITY): Payer: Self-pay | Admitting: Pulmonary Disease

## 2018-01-31 DIAGNOSIS — R918 Other nonspecific abnormal finding of lung field: Secondary | ICD-10-CM

## 2018-02-17 ENCOUNTER — Ambulatory Visit (HOSPITAL_COMMUNITY)
Admission: RE | Admit: 2018-02-17 | Discharge: 2018-02-17 | Disposition: A | Discharge status: Home / Self Care | Payer: No Typology Code available for payment source | Source: Ambulatory Visit | Attending: Pulmonary Disease | Admitting: Pulmonary Disease

## 2018-02-17 DIAGNOSIS — R918 Other nonspecific abnormal finding of lung field: Secondary | ICD-10-CM | POA: Diagnosis present

## 2018-07-16 ENCOUNTER — Emergency Department (HOSPITAL_COMMUNITY): Payer: Medicare Other

## 2018-07-16 ENCOUNTER — Emergency Department (HOSPITAL_COMMUNITY)
Admission: EM | Admit: 2018-07-16 | Discharge: 2018-07-16 | Disposition: A | Discharge status: Home / Self Care | Payer: Medicare Other | Attending: Emergency Medicine | Admitting: Emergency Medicine

## 2018-07-16 ENCOUNTER — Encounter (HOSPITAL_COMMUNITY): Payer: Self-pay | Admitting: Emergency Medicine

## 2018-07-16 ENCOUNTER — Other Ambulatory Visit: Payer: Self-pay

## 2018-07-16 DIAGNOSIS — I10 Essential (primary) hypertension: Secondary | ICD-10-CM | POA: Diagnosis not present

## 2018-07-16 DIAGNOSIS — M79602 Pain in left arm: Secondary | ICD-10-CM

## 2018-07-16 DIAGNOSIS — M5412 Radiculopathy, cervical region: Secondary | ICD-10-CM | POA: Diagnosis not present

## 2018-07-16 DIAGNOSIS — Z79899 Other long term (current) drug therapy: Secondary | ICD-10-CM | POA: Insufficient documentation

## 2018-07-16 HISTORY — DX: Essential (primary) hypertension: I10

## 2018-07-16 MED ORDER — MELOXICAM 15 MG PO TABS: 15.0000 mg | ORAL_TABLET | Freq: Every day | ORAL | 2 refills | Status: AC

## 2018-07-16 MED ORDER — PREDNISONE 10 MG PO TABS: ORAL_TABLET | ORAL | 0 refills | Status: DC

## 2018-07-16 NOTE — Discharge Instructions (Addendum)
Follow up with your Orthopaedist at [facility].

## 2018-07-16 NOTE — ED Triage Notes (Signed)
Pt c/o left shoulder and upper arm pain that started last night, pt reports the pain starts in her left shoulder and runs all the way to her fingertips, pt denies any other symptoms, denies injury, equal grips

## 2018-07-16 NOTE — ED Notes (Signed)
Patient transported to X-ray 

## 2018-07-16 NOTE — ED Notes (Signed)
ED Provider at bedside. 

## 2018-07-17 NOTE — ED Provider Notes (Addendum)
[facility] Provider Note   CSN: [id number] Arrival date & time: 07/16/18  1901    History   Chief Complaint Chief Complaint  Patient presents with  . Arm Pain    HPI Melinda Hayes is a 63 y.o. female.     The history is provided by the patient. No language interpreter was used.  Arm Pain  This is a recurrent problem. The problem occurs constantly. The problem has been gradually worsening. Nothing aggravates the symptoms. Nothing relieves the symptoms. She has tried nothing for the symptoms. The treatment provided no relief.  Pt complains of pain in her left arm and down her left arm   Past Medical History:  Diagnosis Date  . Hypertension   . Rheumatoid arthritis (HCC) 2019    Patient Active Problem List   Diagnosis Date Noted  . Cervical radiculopathy 10/25/2017  . Chronic midline low back pain without sciatica 10/25/2017    Past Surgical History:  Procedure Laterality Date  . ABDOMINAL HYSTERECTOMY    . TOTAL KNEE ARTHROPLASTY Right 2016     OB History   No obstetric history on file.      Home Medications    Prior to Admission medications   Medication Sig Start Date End Date Taking? Authorizing Provider  amLODipine (NORVASC) 5 MG tablet Take by mouth.    [provider]  folic acid (FOLVITE) 1 MG tablet Take by mouth.    [provider]  hydrochlorothiazide (HYDRODIURIL) 25 MG tablet Take by mouth.    [provider]  hydroxychloroquine (PLAQUENIL) 200 MG tablet Take by mouth.    [provider]  meloxicam (MOBIC) 15 MG tablet Take 1 tablet (15 mg total) by mouth daily. 07/16/18 07/16/19  Elson AreasSofia, Harvy Riera K, PA-C  methotrexate (RHEUMATREX) 2.5 MG tablet Take by mouth.    [provider]  naproxen sodium (ALEVE) 220 MG tablet Take by mouth.    [provider]  omeprazole (PRILOSEC) 20 MG capsule Take by mouth.    [provider]  pravastatin (PRAVACHOL) 40 MG tablet Take by  mouth.    [provider]  predniSONE (DELTASONE) 10 MG tablet Take 6 tablets the 1st day,  5 tablets the 2nd day, 4 tablets on 3rd day, 3 tablets on 4th day, 2 tablets on 5th day and 1 tablet on 6th day 07/16/18   Cheron SchaumannSofia, Kalayla Shadden K, PA-C  simvastatin (ZOCOR) 20 MG tablet Take by mouth.    [provider]  triamterene-hydrochlorothiazide (MAXZIDE-25) 37.5-25 MG tablet Take by mouth.    [provider]  Vitamin D, Ergocalciferol, (DRISDOL) 50000 units CAPS capsule Take by mouth.    [provider]    Family History History reviewed. No pertinent family history.  Social History Social History   Tobacco Use  . Smoking status: Never Smoker  . Smokeless tobacco: Never Used  Substance Use Topics  . Alcohol use: Not Currently  . Drug use: Not Currently     Allergies   Patient has no known allergies.   Review of Systems Review of Systems  Musculoskeletal: Positive for myalgias and neck pain.  All other systems reviewed and are negative.    Physical Exam Updated Vital Signs BP (!) 163/95 (BP Location: Right Arm)   Pulse 84   Temp 98.1 F (36.7 C) (Oral)   Resp 17   Ht 5\' 7"  (1.702 m)   Wt 127 kg   SpO2 100%   BMI 43.85 kg/m   Physical Exam  Vitals signs reviewed.  HENT:     Head: Normocephalic.     Nose: Nose normal.  Neck:     Musculoskeletal: Muscular tenderness present.  Cardiovascular:     Rate and Rhythm: Normal rate.     Pulses: Normal pulses.  Pulmonary:     Effort: Pulmonary effort is normal.  Abdominal:     General: Abdomen is flat.  Musculoskeletal:        General: Tenderness present. No swelling or deformity.  Skin:    General: Skin is warm.  Neurological:     General: No focal deficit present.     Mental Status: She is alert.  Psychiatric:        Mood and Affect: Mood normal.      ED Treatments / Results  Labs (all labs ordered are listed, but only abnormal results are displayed) Labs Reviewed - No data to  display  EKG None  Radiology Dg Cervical Spine Complete  Result Date: 07/16/2018 CLINICAL DATA:  63 y/o F; left shoulder and upper arm pain starting last night. EXAM: CERVICAL SPINE - COMPLETE 4+ VIEW COMPARISON:  None. FINDINGS: There is no evidence of cervical spine fracture or prevertebral soft tissue swelling. Alignment is normal. Moderate discogenic degenerative changes are present at the C5-C7 levels with there are prominent endplate marginal osteophytes. Uncovertebral and facet hypertrophy encroach on the right C3-C5 and the left C4-C7 neural foramen. IMPRESSION: 1. No acute fracture or dislocation identified. 2. Moderate cervical spondylosis. Electronically Signed   By: Mitzi Hansen M.D.   On: 07/16/2018 20:26   Dg Shoulder Left  Result Date: 07/16/2018 CLINICAL DATA:  Pain, no trauma EXAM: LEFT SHOULDER - 2+ VIEW COMPARISON:  None. FINDINGS: No fracture or dislocation of the left shoulder. Moderate acromioclavicular and mild glenohumeral arthrosis. IMPRESSION: No fracture or dislocation of the left shoulder. Moderate acromioclavicular and mild glenohumeral arthrosis. Electronically Signed   By: Lauralyn Primes M.D.   On: 07/16/2018 20:21    Procedures Procedures (including critical care time)  Medications Ordered in ED Medications - No data to display   Initial Impression / Assessment and Plan / ED Course  I have reviewed the triage vital signs and the nursing notes.  Pertinent labs & imaging results that were available during my care of the patient were reviewed by me and considered in my medical decision making (see chart for details).        MDM  Pt 's records reviewed.Pt has been followed by [facility] for neck problems..  Pt was supposed to have neck surgery.  Pt reports her appointment was canceled for covid  Pt advised to schedule follow up.  I will treat her with prednisone s tis helped in the past.  Final Clinical Impressions(s) / ED Diagnoses   Final  diagnoses:  Cervical radiculopathy  Left arm pain    ED Discharge Orders         Ordered    predniSONE (DELTASONE) 10 MG tablet     07/16/18 2100    meloxicam (MOBIC) 15 MG tablet  Daily     07/16/18 2100        An After Visit Summary was printed and given to the patient.    Elson Areas, PA-C 07/17/18 1016    [address] 07/17/18 1016    Linwood Dibbles, MD 07/20/18 [phone]

## 2018-12-12 ENCOUNTER — Other Ambulatory Visit: Payer: Self-pay | Admitting: Pulmonary Disease

## 2018-12-12 ENCOUNTER — Other Ambulatory Visit (HOSPITAL_COMMUNITY): Payer: Self-pay | Admitting: Pulmonary Disease

## 2018-12-12 DIAGNOSIS — J8410 Pulmonary fibrosis, unspecified: Secondary | ICD-10-CM

## 2018-12-29 ENCOUNTER — Telehealth: Payer: Self-pay | Admitting: Pulmonary Disease

## 2019-01-01 ENCOUNTER — Ambulatory Visit (HOSPITAL_COMMUNITY): Admission: RE | Admit: 2019-01-01 | Payer: No Typology Code available for payment source | Source: Ambulatory Visit

## 2019-01-01 ENCOUNTER — Encounter (HOSPITAL_COMMUNITY): Payer: Self-pay

## 2019-08-17 IMAGING — CT CT CHEST W/O CM
2 of 3 series · 15 of 36 positions shown, 18 images · non-contrast
Comparison: 11/04/2017 CT

CLINICAL DATA: Follow-up of pulmonary nodules. Asymptomatic.

EXAM:
CT CHEST WITHOUT CONTRAST
TECHNIQUE: Multidetector CT imaging of the chest was performed following the
standard protocol without IV contrast.

[Series 2: thorax · axial · 0.62mm/px · z∈[+1398,+1658]mm · 12 of 154 slices shown, 15 images]
[im 12/154  mediastinal]
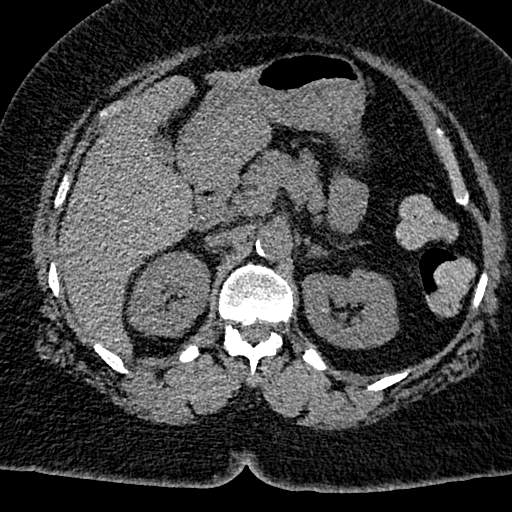
[im 12/154  lung]
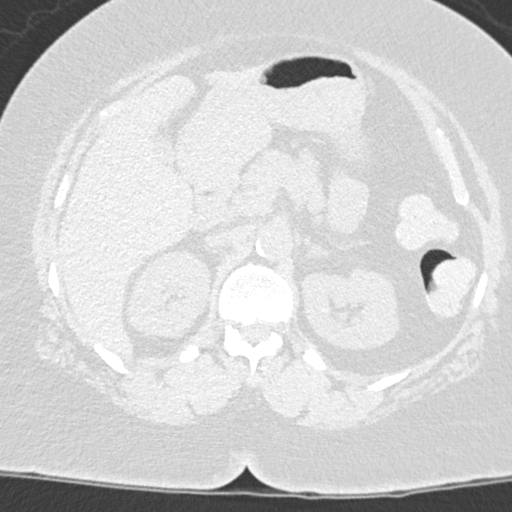
[im 23/154  lung]
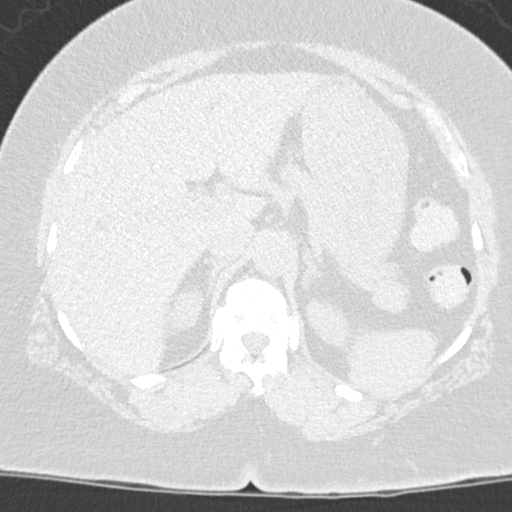
[im 35/154  lung]
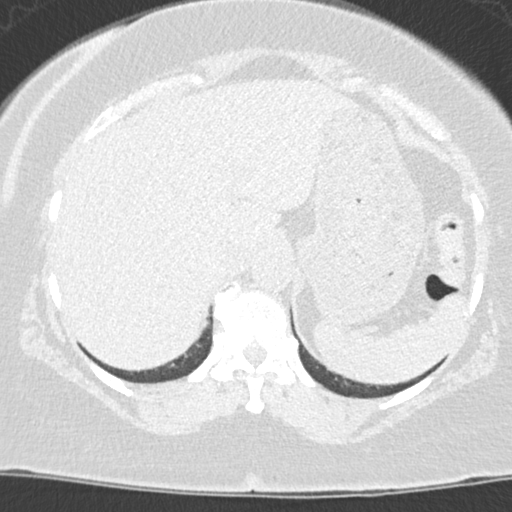
[im 46/154  lung]
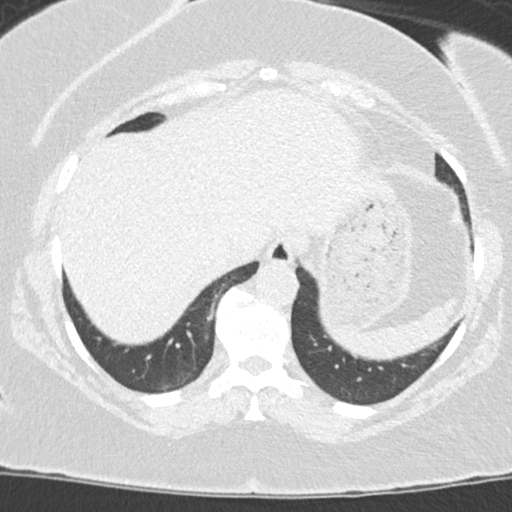
[im 57/154  mediastinal]
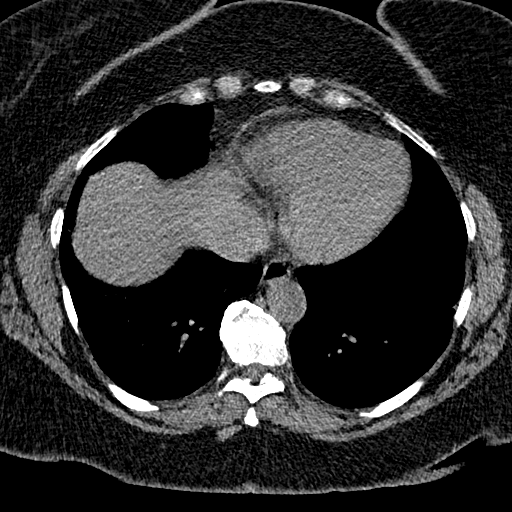
[im 57/154  lung]
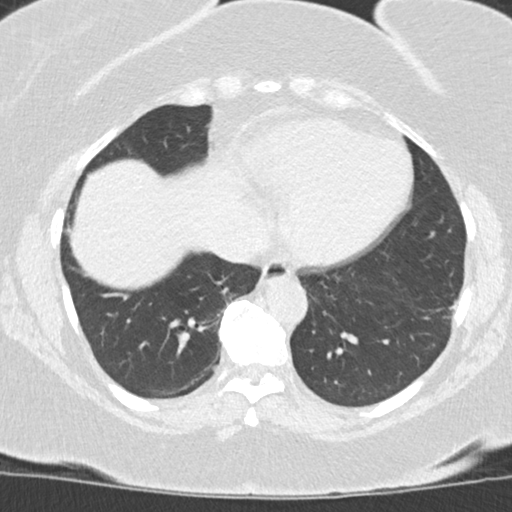
[im 69/154  lung]
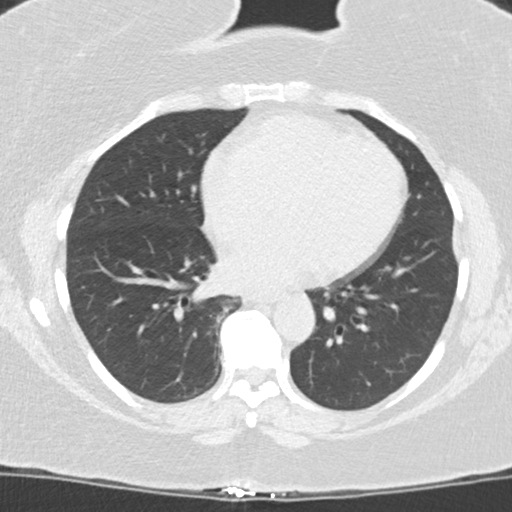
[im 86/154  lung]
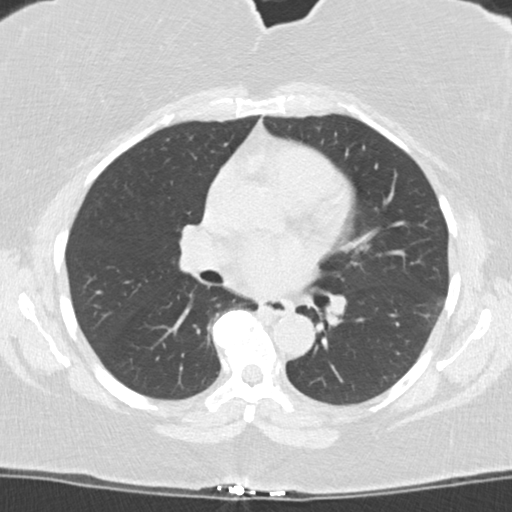
[im 97/154  lung]
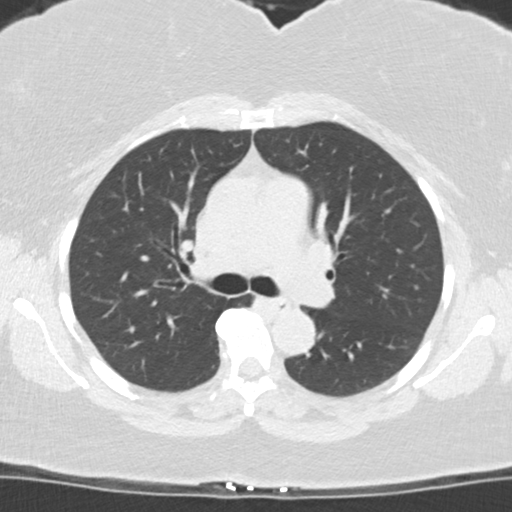
[im 108/154  mediastinal]
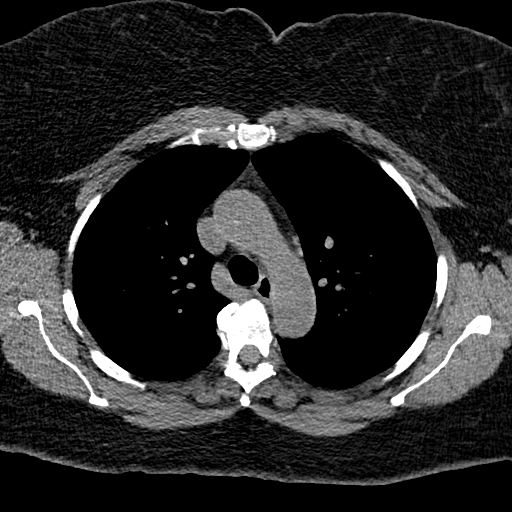
[im 108/154  lung]
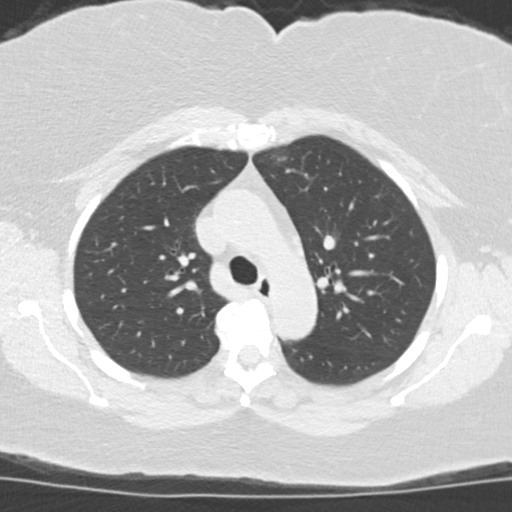
[im 120/154  lung]
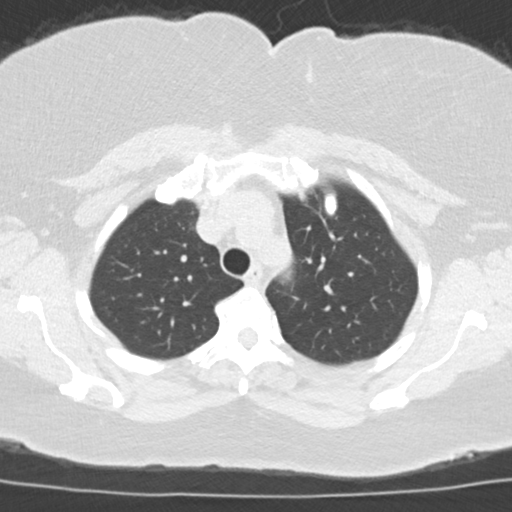
[im 131/154  lung]
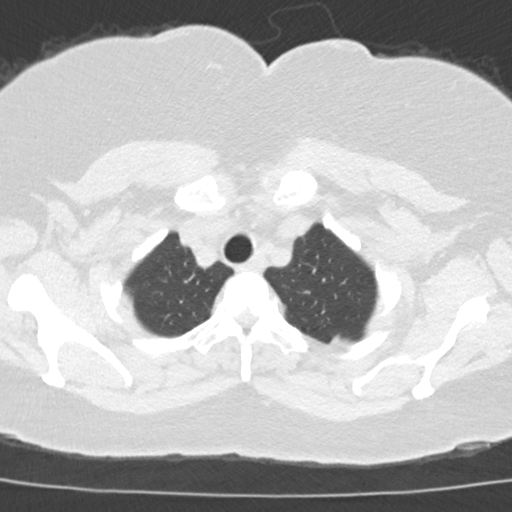
[im 142/154  lung]
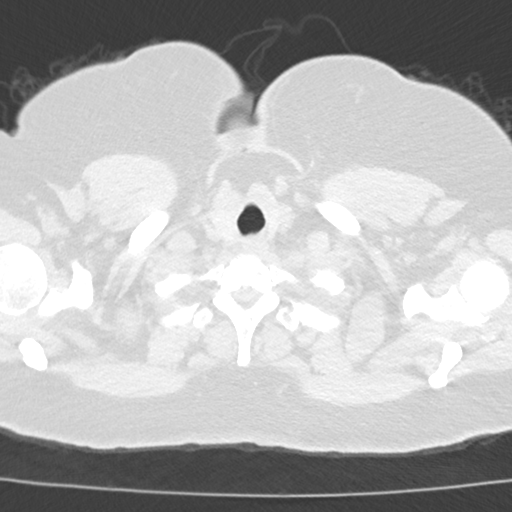

[Series 5: coronal · coronal · 0.60mm/px · 3 of 133 slices shown]
[im 27/133  lung]
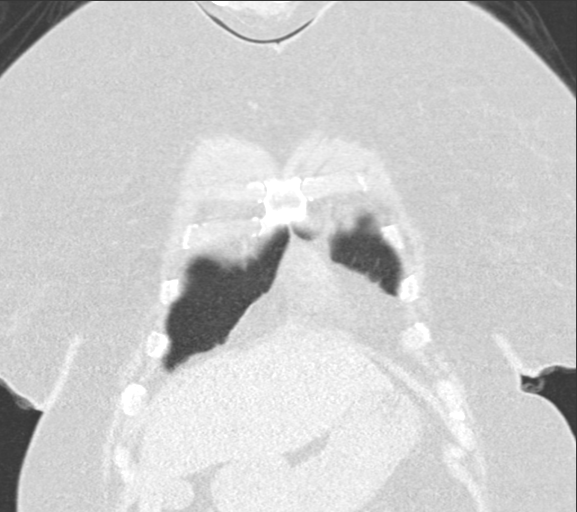
[im 53/133  lung]
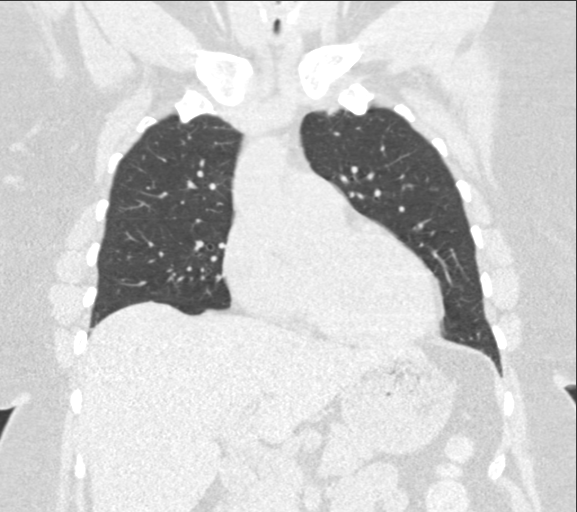
[im 80/133  lung]
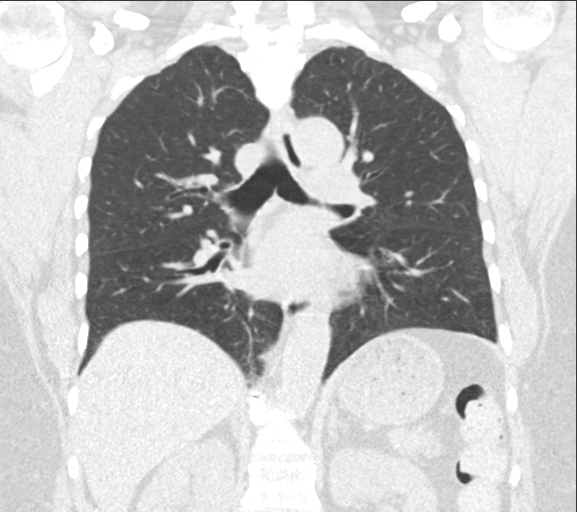

[15 of 36 positions shown; findings below may reference images not displayed]

FINDINGS: Cardiovascular: Bovine arch. Normal heart size, without pericardial
effusion. Lad coronary artery calcification.

Mediastinum/Nodes: No mediastinal or definite hilar adenopathy,
given limitations of unenhanced CT.

Lungs/Pleura: No pleural fluid. The majority of the previously
described pulmonary nodules are similar. Example vague soft tissue
density 5 mm nodule on image 51/4 in the right upper lobe. 4 mm
posterior right lower lobe lobe soft tissue density nodule on image
91/4.

The posterior right upper lobe 6 mm subpleural pulmonary nodule has
resolved. There is a new subpleural right lower lobe pulmonary
nodule of 6 mm on image 97/4.

Mild right-sided scarring again identified.

Upper Abdomen: Right hepatic lobe cyst. Normal imaged portions of
the spleen, stomach, pancreas, gallbladder, adrenal glands, kidneys.

Musculoskeletal: No acute osseous abnormality. Moderate thoracic
spondylosis.
IMPRESSION: 1. The majority of the previously described pulmonary nodules are
similar. There is a new right lower lobe pulmonary nodule which
measures 6 mm. Non-contrast chest CT at 6-12 months is recommended.
If the nodule is stable at time of repeat CT, then future CT at
18-24 months (from today's scan) is considered optional for low-risk
patients, but is recommended for high-risk patients. This
recommendation follows the consensus statement: Guidelines for
Management of Incidental Pulmonary Nodules Detected on CT Images:
2. Age advanced coronary artery atherosclerosis. Recommend
assessment of coronary risk factors and consideration of medical
therapy.

## 2021-04-18 ENCOUNTER — Emergency Department (HOSPITAL_COMMUNITY): Payer: No Typology Code available for payment source

## 2021-04-18 ENCOUNTER — Other Ambulatory Visit: Payer: Self-pay

## 2021-04-18 ENCOUNTER — Encounter (HOSPITAL_COMMUNITY): Payer: Self-pay | Admitting: *Deleted

## 2021-04-18 ENCOUNTER — Emergency Department (HOSPITAL_COMMUNITY)
Admission: EM | Admit: 2021-04-18 | Discharge: 2021-04-18 | Disposition: A | Discharge status: Home / Self Care | Payer: No Typology Code available for payment source | Attending: Emergency Medicine | Admitting: Emergency Medicine

## 2021-04-18 DIAGNOSIS — Z79899 Other long term (current) drug therapy: Secondary | ICD-10-CM | POA: Diagnosis not present

## 2021-04-18 DIAGNOSIS — R109 Unspecified abdominal pain: Secondary | ICD-10-CM

## 2021-04-18 DIAGNOSIS — M25512 Pain in left shoulder: Secondary | ICD-10-CM | POA: Diagnosis not present

## 2021-04-18 DIAGNOSIS — R1012 Left upper quadrant pain: Secondary | ICD-10-CM | POA: Insufficient documentation

## 2021-04-18 LAB — URINALYSIS, ROUTINE W REFLEX MICROSCOPIC
Bilirubin Urine: NEGATIVE
Glucose, UA: NEGATIVE mg/dL
Ketones, ur: NEGATIVE mg/dL
Leukocytes,Ua: NEGATIVE
Nitrite: NEGATIVE
Protein, ur: NEGATIVE mg/dL
Specific Gravity, Urine: 1.005 (ref 1.005–1.030)
pH: 5 (ref 5.0–8.0)

## 2021-04-18 LAB — CBC WITH DIFFERENTIAL/PLATELET
Abs Immature Granulocytes: 0.05 10*3/uL (ref 0.00–0.07)
Basophils Absolute: 0.1 10*3/uL (ref 0.0–0.1)
Basophils Relative: 1 %
Eosinophils Absolute: 0.3 10*3/uL (ref 0.0–0.5)
Eosinophils Relative: 3 %
HCT: 41.7 % (ref 36.0–46.0)
Hemoglobin: 12.5 g/dL (ref 12.0–15.0)
Immature Granulocytes: 1 %
Lymphocytes Relative: 30 %
Lymphs Abs: 2.7 10*3/uL (ref 0.7–4.0)
MCH: 24.4 pg — ABNORMAL LOW (ref 26.0–34.0)
MCHC: 30 g/dL (ref 30.0–36.0)
MCV: 81.4 fL (ref 80.0–100.0)
Monocytes Absolute: 0.6 10*3/uL (ref 0.1–1.0)
Monocytes Relative: 7 %
Neutro Abs: 5.2 10*3/uL (ref 1.7–7.7)
Neutrophils Relative %: 58 %
Platelets: 340 10*3/uL (ref 150–400)
RBC: 5.12 MIL/uL — ABNORMAL HIGH (ref 3.87–5.11)
RDW: 14.9 % (ref 11.5–15.5)
WBC: 8.8 10*3/uL (ref 4.0–10.5)
nRBC: 0 % (ref 0.0–0.2)

## 2021-04-18 LAB — COMPREHENSIVE METABOLIC PANEL
ALT: 13 U/L (ref 0–44)
AST: 12 U/L — ABNORMAL LOW (ref 15–41)
Albumin: 3.9 g/dL (ref 3.5–5.0)
Alkaline Phosphatase: 101 U/L (ref 38–126)
Anion gap: 9 (ref 5–15)
BUN: 15 mg/dL (ref 8–23)
CO2: 26 mmol/L (ref 22–32)
Calcium: 9.3 mg/dL (ref 8.9–10.3)
Chloride: 101 mmol/L (ref 98–111)
Creatinine, Ser: 0.94 mg/dL (ref 0.44–1.00)
GFR, Estimated: >60 mL/min (ref >60)
Glucose, Bld: 107 mg/dL — ABNORMAL HIGH (ref 70–99)
Potassium: 3.8 mmol/L (ref 3.5–5.1)
Sodium: 136 mmol/L (ref 135–145)
Total Bilirubin: 0.5 mg/dL (ref 0.3–1.2)
Total Protein: 7.8 g/dL (ref 6.5–8.1)

## 2021-04-18 NOTE — ED Provider Notes (Signed)
[facility] EMERGENCY DEPARTMENT Provider Note   CSN: [id number] Arrival date & time: 04/18/21  2013     History  Chief Complaint  Patient presents with   Flank Pain    Melinda Hayes is a 66 y.o. female.  HPI She is presenting for evaluation of pain in the left shoulder, left flank and left upper abdomen.  Onset of symptoms today.  The symptoms wax and wane and are currently resolved.  She denies urinary frequency, hematuria, cough or shortness of breath.  She has had some nausea without vomiting.    Home Medications Prior to Admission medications   Medication Sig Start Date End Date Taking? Authorizing Provider  amLODipine (NORVASC) 5 MG tablet Take by mouth.    [provider]  folic acid (FOLVITE) 1 MG tablet Take by mouth.    [provider]  hydrochlorothiazide (HYDRODIURIL) 25 MG tablet Take by mouth.    [provider]  hydroxychloroquine (PLAQUENIL) 200 MG tablet Take by mouth.    [provider]  methotrexate (RHEUMATREX) 2.5 MG tablet Take by mouth.    [provider]  naproxen sodium (ALEVE) 220 MG tablet Take by mouth.    [provider]  omeprazole (PRILOSEC) 20 MG capsule Take by mouth.    [provider]  pravastatin (PRAVACHOL) 40 MG tablet Take by mouth.    [provider]  predniSONE (DELTASONE) 10 MG tablet Take 6 tablets the 1st day,  5 tablets the 2nd day, 4 tablets on 3rd day, 3 tablets on 4th day, 2 tablets on 5th day and 1 tablet on 6th day 07/16/18   Caryl Ada K, PA-C  simvastatin (ZOCOR) 20 MG tablet Take by mouth.    [provider]  triamterene-hydrochlorothiazide (MAXZIDE-25) 37.5-25 MG tablet Take by mouth.    [provider]  Vitamin D, Ergocalciferol, (DRISDOL) 50000 units CAPS capsule Take by mouth.    [provider]      Allergies    Patient has no known allergies.    Review of Systems   Review of Systems  Physical Exam Updated Vital  Signs BP (!) 141/80    Pulse 83    Temp 97.8 F (36.6 C) (Oral)    Resp 17    Ht 5\' 7"  (1.702 m)    Wt 136.1 kg    SpO2 100%    BMI 46.99 kg/m  Physical Exam Vitals and nursing note reviewed.  Constitutional:      General: She is not in acute distress.    Appearance: She is well-developed. She is obese. She is not ill-appearing or diaphoretic.  HENT:     Head: Normocephalic and atraumatic.     Right Ear: External ear normal.     Left Ear: External ear normal.  Eyes:     Conjunctiva/sclera: Conjunctivae normal.     Pupils: Pupils are equal, round, and reactive to light.  Neck:     Trachea: Phonation normal.  Cardiovascular:     Rate and Rhythm: Normal rate and regular rhythm.     Heart sounds: Normal heart sounds.  Pulmonary:     Effort: Pulmonary effort is normal.     Breath sounds: Normal breath sounds.  Chest:     Chest wall: No tenderness.  Abdominal:     General: There is no distension.     Palpations: Abdomen is soft.     Tenderness: There is no abdominal tenderness.  Musculoskeletal:        General:  Normal range of motion.     Cervical back: Normal range of motion and neck supple.     Comments: Mild tenderness, diffusely on left posterior shoulder without deformity.  I am able to mobilize the left arm without pain.  Skin:    General: Skin is warm and dry.  Neurological:     Mental Status: She is alert and oriented to person, place, and time.     Cranial Nerves: No cranial nerve deficit.     Sensory: No sensory deficit.     Motor: No abnormal muscle tone.     Coordination: Coordination normal.  Psychiatric:        Mood and Affect: Mood normal.        Behavior: Behavior normal.        Thought Content: Thought content normal.        Judgment: Judgment normal.    ED Results / Procedures / Treatments   Labs (all labs ordered are listed, but only abnormal results are displayed) Labs Reviewed  CBC WITH DIFFERENTIAL/PLATELET - Abnormal; Notable for the following  components:      Result Value   RBC 5.12 (*)    MCH 24.4 (*)    All other components within normal limits  COMPREHENSIVE METABOLIC PANEL - Abnormal; Notable for the following components:   Glucose, Bld 107 (*)    AST 12 (*)    All other components within normal limits  URINALYSIS, ROUTINE W REFLEX MICROSCOPIC - Abnormal; Notable for the following components:   APPearance HAZY (*)    Hgb urine dipstick SMALL (*)    Bacteria, UA MANY (*)    All other components within normal limits    EKG EKG Interpretation  Date/Time:  Saturday April 18 2021 20:32:24 EST Ventricular Rate:  92 PR Interval:  142 QRS Duration: 95 QT Interval:  362 QTC Calculation: 448 R Axis:   60 Text Interpretation: Sinus rhythm Biatrial enlargement No old tracing to compare Confirmed by Daleen Bo [phone]) on 04/18/2021 9:08:33 PM  Radiology DG Chest 2 View  Result Date: 04/18/2021 CLINICAL DATA:  Chest pain. EXAM: CHEST - 2 VIEW COMPARISON:  Chest CT 02/17/2018 FINDINGS: The cardiomediastinal contours are normal. The lungs are clear. Pulmonary vasculature is normal. No consolidation, pleural effusion, or pneumothorax. Surgical hardware in the lower cervical spine is partially included. Thoracic spondylosis. No acute osseous abnormalities are seen. IMPRESSION: No acute chest findings. Electronically Signed   By: Keith Rake M.D.   On: 04/18/2021 21:54   CT Renal Stone Study  Result Date: 04/18/2021 CLINICAL DATA:  Left flank pain and nausea. EXAM: CT ABDOMEN AND PELVIS WITHOUT CONTRAST TECHNIQUE: Multidetector CT imaging of the abdomen and pelvis was performed following the standard protocol without IV contrast. RADIATION DOSE REDUCTION: This exam was performed according to the departmental dose-optimization program which includes automated exposure control, adjustment of the mA and/or kV according to patient size and/or use of iterative reconstruction technique. COMPARISON:  None. FINDINGS: Lower chest:  No confluent consolidation or pleural effusion. Hepatobiliary: Enlarged liver spanning 18.8 cm cranial caudal. Suggestion of mild hepatic steatosis. 12 mm low-density lesion in the central right hepatic lobe, series 2, image 23. This is unchanged in size from chest CT 02/17/2018, likely benign cyst. Gallbladder physiologically distended, no calcified stone. No biliary dilatation. Pancreas: No ductal dilatation or inflammation. Spleen: Normal in size without focal abnormality. Adrenals/Urinary Tract: Normal adrenal glands. No hydronephrosis or renal calculi. No perinephric edema. No evidence of focal renal lesion on  this unenhanced exam. Decompressed ureters without stones along the course. Partially distended urinary bladder, no bladder stones or wall thickening. Stomach/Bowel: Stomach is decompressed. There is no small bowel obstruction or inflammatory change. The appendix is not definitively visualized, no evidence of appendicitis. Moderate stool in the ascending and transverse colon. Small volume of stool distally. The sigmoid colon is redundant. No colonic inflammation. Vascular/Lymphatic: Normal caliber abdominal aorta. Mild aorto bi-iliac atherosclerosis. No portal venous or mesenteric gas. No abdominopelvic adenopathy. Reproductive: Status post hysterectomy. No adnexal masses. Other: No ascites, free air, or abdominopelvic collection. No abdominal wall hernia. Musculoskeletal: Degenerative change in the lumbar spine with primarily facet hypertrophy. Degenerative change of the pubic symphysis and sacroiliac joints. There is scattered bone islands. No acute osseous abnormalities or explanation for flank pain. IMPRESSION: 1. No renal stones or obstructive uropathy. 2. Hepatomegaly and probable mild hepatic steatosis. 12 mm low-density lesion in the central right hepatic lobe is unchanged from 2019 chest CT and likely cyst. Aortic Atherosclerosis (ICD10-I70.0). Electronically Signed   By: Keith Rake M.D.    On: 04/18/2021 21:51    Procedures Procedures    Medications Ordered in ED Medications - No data to display  ED Course/ Medical Decision Making/ A&P                           Medical Decision Making Patient presenting with pain in the thorax including shoulder region and flank.  Differential diagnosis includes pneumonia, muscle strain, cervical radiculopathy, thoracic radiculopathy, shingles, kidney stone pain, UTI.  Low level suspicion for other intra-abdominal processes including intestinal disorders, liver and spleen dysfunction  Amount and/or Complexity of Data Reviewed Independent Historian:     Details: She is a cogent historian External Data Reviewed: notes.    Details: She is regularly seeing PCP for counseling and obesity management. Labs: ordered.    Details: CBC, metabolic panel, urinalysis-normal except glucose slightly elevated, AST low, small amount of hemoglobin and bacteria in urine. Radiology: ordered and independent interpretation performed.    Details: Chest x-ray, CT abdomen pelvis-no acute abnormalities  Risk Decision regarding hospitalization. Risk Details: Patient presenting with nonspecific pain, evaluated for problems including pneumonia, pneumothorax, renal stones, UTI and intra-abdominal disorders.  Evaluation is reassuring.  Slightly abnormal urinalysis however patient does not have urinary frequency, dysuria or hematuria.  Doubt UTI.  Recommended symptomatic treatment for patient with expectant management follow-up.  She does not require hospitalization at this time.           Final Clinical Impression(s) / ED Diagnoses Final diagnoses:  Flank pain    Rx / DC Orders ED Discharge Orders     None         Daleen Bo, MD 04/19/21 1520

## 2021-04-18 NOTE — ED Notes (Signed)
Pt to ct 

## 2021-04-18 NOTE — ED Triage Notes (Signed)
Pt states with sudden pain to left shoulder blade down to her abd. + nausea, mild SOB per pt.

## 2021-04-18 NOTE — Discharge Instructions (Signed)
The testing today did not show any serious problems.  Try taking Tylenol every 4 hours, and using heat on the sore area 3-4 times a day.  See your doctor for checkup if not better in 3 to 4 days.

## 2021-04-18 NOTE — ED Notes (Signed)
Went over d/c papers. All questions answered. Wheeled out to car.

## 2021-04-18 NOTE — ED Notes (Signed)
Ambulatory to restroom at this time. Standby assist. Pt usually uses a cane but does not have it with her.

## 2021-12-20 DIAGNOSIS — S29019A Strain of muscle and tendon of unspecified wall of thorax, initial encounter: Secondary | ICD-10-CM | POA: Diagnosis not present

## 2021-12-20 DIAGNOSIS — X500XXA Overexertion from strenuous movement or load, initial encounter: Secondary | ICD-10-CM | POA: Diagnosis not present

## 2021-12-20 DIAGNOSIS — Z79899 Other long term (current) drug therapy: Secondary | ICD-10-CM | POA: Diagnosis not present

## 2021-12-20 DIAGNOSIS — R079 Chest pain, unspecified: Secondary | ICD-10-CM | POA: Diagnosis not present

## 2021-12-20 DIAGNOSIS — I10 Essential (primary) hypertension: Secondary | ICD-10-CM | POA: Diagnosis not present

## 2021-12-20 DIAGNOSIS — J811 Chronic pulmonary edema: Secondary | ICD-10-CM | POA: Diagnosis not present

## 2021-12-20 DIAGNOSIS — M25511 Pain in right shoulder: Secondary | ICD-10-CM | POA: Diagnosis not present

## 2022-02-01 ENCOUNTER — Ambulatory Visit: Payer: Self-pay | Admitting: Surgery

## 2022-02-08 ENCOUNTER — Encounter: Payer: Self-pay | Admitting: Dietician

## 2022-02-08 ENCOUNTER — Encounter: Payer: No Typology Code available for payment source | Attending: Surgery | Admitting: Dietician

## 2022-02-08 VITALS — Ht 67.0 in | Wt 291.2 lb

## 2022-02-08 DIAGNOSIS — E669 Obesity, unspecified: Secondary | ICD-10-CM | POA: Diagnosis present

## 2022-02-08 NOTE — Progress Notes (Signed)
Nutrition Assessment for Bariatric Surgery Medical Nutrition Therapy Appt Start Time: 10:45    End Time: 11:56  Patient was seen on 02/08/2022 for Pre-Operative Nutrition Assessment. Letter of approval faxed to [facility] bariatric surgery program coordinator on 02/08/2022.   Referral stated Supervised Weight Loss (SWL) visits needed: 0  Planned surgery: Sleeve Gastrectomy   NUTRITION ASSESSMENT   Anthropometrics  Start weight at [facility]: 291.2 lbs (date: 02/08/2022)  Height: 67 in BMI: 45.61 kg/m2     Clinical  Medical hx: Obesity, HTN, hyperlipidemia, knee replacement Medications: HCTZ, amlodipine, multivitamin  Labs: A1C 6.1 (02/25/2021) Notable signs/symptoms: walking with a cane Any previous deficiencies? No  Micronutrient Nutrition Focused Physical Exam: Hair: No issues observed Eyes: No issues observed Mouth: No issues observed Neck: No issues observed Nails: No issues observed Skin: No issues observed  Lifestyle & Dietary Hx  Pt arrived walking with a cane. Patient lives with her son. The pt performs the food shopping and the pt prepares the meals. She reports that she typically skips or misses 2 out of 21 possible meals per week. She may have 0-1 meals per week that are take-out or at a restaurant.  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 She denies binge eating though has felt shame and/or guilt after eating too much food.  She denies having used laxatives or vomiting to facilitate weight loss. She admits to emotional eating during times of stress. She states that she knows the difference between hunger and thirst and can tell when she is full.  Physical Activity: ADLs  Sleep Hygiene: duration and quality: not much sleep, 3-4 hours a night  Current Patient Perceived Stress Level as stated by pt on a scale of 1-10:  3      Stress Management Techniques: nothing noted  Fruit servings per week: 2 Non starchy vegetable servings per week: 2-3 Whole  Grains per week: 4-5   24-Hr Dietary Recall First Meal: skip or oatmeal with toast or 2 boiled eggs with toast, coffee Snack:  Second Meal: grilled chicken salad with ranch dressing Snack:  Third Meal: green beans or green peas, baked salmon, 1/2 baked potato. Snack:  Beverages: water, sweet tea, lemonade, coffee  Alcoholic beverages per week: 0   Estimated Energy Needs Calories: 1600   NUTRITION DIAGNOSIS  Overweight/obesity ([place]-3.3) related to past poor dietary habits and physical inactivity as evidenced by patient w/ planned sleeve surgery following dietary guidelines for continued weight loss.    NUTRITION INTERVENTION  Nutrition counseling (C-1) and education (E-2) to facilitate bariatric surgery goals.  Educated pt on micronutrient deficiencies post surgery and strategies to mitigate that risk   Pre-Op Goals Reviewed with the Patient Track food and beverage intake (pen and paper, MyFitness Pal, Baritastic app, etc.) Make healthy food choices while monitoring portion sizes Consume 3 meals per day or try to eat every 3-5 hours Avoid concentrated sugars and fried foods Keep sugar & fat in the single digits per serving on food labels Practice CHEWING your food (aim for applesauce consistency) Practice not drinking 15 minutes before, during, and 30 minutes after each meal and snack Avoid all carbonated beverages (ex: soda, sparkling beverages)  Limit caffeinated beverages (ex: coffee, tea, energy drinks) Avoid all sugar-sweetened beverages (ex: regular soda, sports drinks)  Avoid alcohol  Aim for 64-100 ounces of FLUID daily (with at least half of fluid intake being plain water)  Aim for at least 60-80 grams of PROTEIN daily Look for a liquid protein  source that contains ?15 g protein and ?5 g carbohydrate (ex: shakes, drinks, shots) Make a list of non-food related activities Physical activity is an important part of a healthy lifestyle so keep it moving! The goal is to  reach 150 minutes of exercise per week, including cardiovascular and weight baring activity.  *Goals that are bolded indicate the pt would like to start working towards these  Handouts Provided Include  Bariatric Surgery handouts (Nutrition Visits, Pre-Op Goals, Protein Shakes, Vitamins & Minerals)  Learning Style & Readiness for Change Teaching method utilized: Visual & Auditory  Demonstrated degree of understanding via: Teach Back  Readiness Level: Preparation Barriers to learning/adherence to lifestyle change: nothing identified  RD's Notes for Next Visit     MONITORING & EVALUATION Dietary intake, weekly physical activity, body weight, and pre-op goals reached at next nutrition visit.    Next Steps  Patient is to follow up at [facility] for Pre-Op Class >2 weeks before surgery for further nutrition education.

## 2022-03-05 ENCOUNTER — Encounter (HOSPITAL_BASED_OUTPATIENT_CLINIC_OR_DEPARTMENT_OTHER): Payer: Self-pay | Admitting: Physical Therapy

## 2022-03-05 ENCOUNTER — Ambulatory Visit (HOSPITAL_BASED_OUTPATIENT_CLINIC_OR_DEPARTMENT_OTHER): Payer: No Typology Code available for payment source | Attending: Family Medicine | Admitting: Physical Therapy

## 2022-03-05 DIAGNOSIS — R2689 Other abnormalities of gait and mobility: Secondary | ICD-10-CM | POA: Diagnosis not present

## 2022-03-05 DIAGNOSIS — M545 Low back pain, unspecified: Secondary | ICD-10-CM | POA: Insufficient documentation

## 2022-03-05 DIAGNOSIS — M25562 Pain in left knee: Secondary | ICD-10-CM | POA: Diagnosis not present

## 2022-03-05 DIAGNOSIS — G8929 Other chronic pain: Secondary | ICD-10-CM | POA: Insufficient documentation

## 2022-03-05 DIAGNOSIS — M5459 Other low back pain: Secondary | ICD-10-CM | POA: Insufficient documentation

## 2022-03-05 DIAGNOSIS — M6281 Muscle weakness (generalized): Secondary | ICD-10-CM | POA: Insufficient documentation

## 2022-03-05 NOTE — Therapy (Unsigned)
OUTPATIENT PHYSICAL THERAPY THORACOLUMBAR EVALUATION   Patient Name: Melinda Hayes MRN: [id number] DOB:1955/09/11, 67 y.o., female Today's Date: 03/07/2022  END OF SESSION:  PT End of Session - 03/05/22    Visit Number 1   Number of Visits 15   Date for PT Re-Evaluation 05/14/22   Authorization Type va   PT Start Time  950   PT Stop Time 1030   PT Time Calculation (min) 40   Activity Tolerance Patient tolerated treatment well    Behavior During Therapy  WFL for tasks assessed/performed      Past Medical History:  Diagnosis Date   Hypertension    Rheumatoid arthritis ([place]) 2019   Past Surgical History:  Procedure Laterality Date   ABDOMINAL HYSTERECTOMY     BACK SURGERY     TOTAL KNEE ARTHROPLASTY Right 2016   Patient Active Problem List   Diagnosis Date Noted   Cervical radiculopathy 10/25/2017   Chronic midline low back pain without sciatica 10/25/2017    PCP: Myrla Halsted, MD  REFERRING PROVIDER: Myrla Halsted, MD   REFERRING DIAG: M54.50 (ICD-10-CM) - Low back pain, unspecified   Rationale for Evaluation and Treatment: M54.50 (ICD-10-CM) - Low back pain, unspecified   THERAPY DIAG:  Other low back pain  Chronic pain of left knee  Muscle weakness (generalized)  Other abnormalities of gait and mobility  ONSET DATE: >1yrs  SUBJECTIVE:                                                                                                                                                                                           SUBJECTIVE STATEMENT: Pain in LB and left knee.  Had RTKR in 2016. Uses cane out of home, holds to counters at home. Does go grocery shopping but does not carry items into the house.    PERTINENT HISTORY:  Morbid obesity; HTN, L TKR  PAIN:  Are you having pain? Yes: NPRS scale: 4/10 current; worst 7/10 Pain location: mid back l>R although across center, left knee Aggravating factors: lying down; activity >1 hours back  twists Relieving factors: sitting; heating pad, ice packs, meds  PRECAUTIONS: Back and Fall  WEIGHT BEARING RESTRICTIONS: No  FALLS:  Has patient fallen in last 6 months?  Pt denies  LIVING ENVIRONMENT: Lives with: lives with their family Lives in: House/apartment Stairs: No Has following equipment at home: Single point cane  OCCUPATION: retired/disabled  PLOF: Independent with household mobility with device  PATIENT GOALS: lose weight; improve ability to walk, decrease pain  NEXT MD VISIT:   OBJECTIVE:   DIAGNOSTIC FINDINGS:  None in chart. 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 No tenderness noted.  LUMBAR ROM:  Pt limited in all Lumbar ROM by Pain: AROM eval  Flexion Finger tips below knees (~50%)  Extension 50%  Right lateral flexion 50%  Left lateral flexion 25%  Right rotation   Left rotation    (Blank rows = not tested)  LOWER EXTREMITY ROM:     Active  Right eval Left eval  Hip flexion    Hip extension    Hip abduction    Hip adduction    Hip internal rotation    Hip external rotation    Knee flexion  90  Knee extension  0  Ankle dorsiflexion    Ankle plantarflexion    Ankle inversion    Ankle eversion     (Blank rows = not tested)  LOWER EXTREMITY MMT:    MMT Right eval Left eval  Hip flexion 3+ supine 3  Hip extension    Hip abduction 5 5  Hip adduction 5 5  Hip internal rotation    Hip external rotation    Knee flexion 5 3+ P!  Knee extension 5 4-P!  Ankle dorsiflexion 5 5  Ankle plantarflexion    Ankle inversion    Ankle eversion      (Blank rows = not tested)  LUMBAR SPECIAL TESTS:  Straight leg raise test: Positive and Slump test: Negative  FUNCTIONAL TESTS:  5x Sts 31.05 armed chair Tug 33 with cane  GAIT: Distance walked: 50 Assistive device utilized: Single point cane Level of assistance: Complete Independence Comments: antalgic limb lle, cane left side, decreased stance time lle  TODAY'S TREATMENT:                                                                                                                              Evaluation Objective testing Assignement of HEP   PATIENT EDUCATION:  Education details: Discussed eval findings, rehab rationale and POC and patient is in agreement Person educated: Patient Education method: Explanation Education comprehension: verbalized understanding  HOME EXERCISE PROGRAM: Access Code: [email or web address] URL: [email or web address] Date: 03/05/2022 Prepared by: Denton Meek  Exercises - Supine Quad Set  - 1 x daily - 7 x weekly - 3 sets - 10 reps - Supine Posterior Pelvic Tilt  - 1 x daily - 7 x weekly - 3 sets - 10 reps  ASSESSMENT:  CLINICAL IMPRESSION: Patient is a 67 y.o. f who was seen today for physical therapy evaluation and treatment for LBP. She present  having used a wc from the lobby to (and from) pool  as she could not tolerate distance walking ~400 ft). With testing she has decreased lumbar ROM, core strength and significant pain sx with movement although not any radicular issues. She also complains of left knee pain due to OA.  Right knee with some discomfort but not limiting. R TKR x 8 yrs ago. She is planning on undergoing a sleeve gastrectomy for weight loss in near future with BMI >45.  She will benefit from skilled physical therapy, aquatic to use the properties of water to initiate and gain strength, ROM and retrain balance and proprioception then land based when appropriate to further her improvement with functional mobility and  ADL's.  OBJECTIVE IMPAIRMENTS: Abnormal gait, decreased activity tolerance, decreased balance, decreased endurance, decreased mobility, difficulty walking, decreased ROM, decreased strength, improper body mechanics, obesity, and pain.   ACTIVITY LIMITATIONS: carrying, lifting, bending, standing, squatting, stairs, transfers, and locomotion level  PARTICIPATION LIMITATIONS: meal prep, cleaning, laundry, shopping, community activity, and yard work  PERSONAL FACTORS: Age, Behavior pattern, Fitness, and 1-2 comorbidities: morbid obesity, HTN, RA  are also affecting patient's functional outcome.   REHAB POTENTIAL: Good  CLINICAL DECISION MAKING: Evolving/moderate complexity  EVALUATION COMPLEXITY: Moderate   GOALS: Goals reviewed with patient? Yes  SHORT TERM GOALS: Target date: 04/09/22  Pt to tolerate full sessions of aquatic therapy without any increase in pain Baseline:TBA Goal status: INITIAL  2.  Pt will improve on 5 X STS test to < 25s to demonstrate improving strength, transitional  movements and balance Baseline: 5x Sts 31.05 armed chair Goal status: INITIAL  3.  Pt will use cane properly ( right hand) to decrease l knee pain and decrease LBP and improve safety/gait Baseline: Using cane in left hand (dominant) Goal status: INITIAL  4.  Pt will improve strength in all areas where there is a deficit by at least 1/2 grade. Baseline: see chart Goal status: INITIAL   LONG TERM GOALS: Target date: 05/14/22  Pt to meet Foto goal of 58% Baseline: 44% Goal status: INITIAL  2.  Pt will improve on TUG test to <or=20s to demonstrate improve LE function, mobility and decreased fall risk Baseline: Tug 33 with cane Goal status: INITIAL  3.  Pt will report toleration to activity to be up towards 2 hour before being limited by LB pain Baseline: 1 hour Goal status: INITIAL  4.  Pt worst LBP to be on or below 4/10 to demonstrate improved management of chronic pain Baseline:  7/10 Goal status: INITIAL  5.  Pt will improve ROM of Lumbar spine in all areas by 25% to demonstrate improving function. Baseline: see chart Goal status: INITIAL    PLAN:  PT FREQUENCY: 1-2x/week  PT DURATION: 10 weeks 15 [facility]  PLANNED INTERVENTIONS: Therapeutic exercises, Therapeutic activity, Neuromuscular re-education, Balance training, Gait training, Patient/Family education, Self Care, Joint mobilization, Joint manipulation, Stair training, Orthotic/Fit training, DME instructions, Aquatic Therapy, Dry Needling, Electrical stimulation, Spinal manipulation, Spinal mobilization, Cryotherapy, Moist heat, Splintting, Taping, Traction, Ultrasound, Ionotophoresis 4mg /ml Dexamethasone, Manual therapy, and Re-evaluation.  PLAN FOR NEXT SESSION: Aquatics for strengthening and ROM core and LE, balance retraining, stair climbing, proprioception retraining, toleration to activity   Denton Meek, PT MPT 03/07/2022, 5:42 PM

## 2022-03-17 ENCOUNTER — Ambulatory Visit (INDEPENDENT_AMBULATORY_CARE_PROVIDER_SITE_OTHER): Payer: Medicare PPO | Admitting: Licensed Clinical Social Worker

## 2022-03-17 DIAGNOSIS — F4320 Adjustment disorder, unspecified: Secondary | ICD-10-CM
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
## 2022-03-22 ENCOUNTER — Ambulatory Visit (HOSPITAL_BASED_OUTPATIENT_CLINIC_OR_DEPARTMENT_OTHER): Payer: No Typology Code available for payment source | Admitting: Physical Therapy

## 2022-03-22 ENCOUNTER — Encounter (HOSPITAL_BASED_OUTPATIENT_CLINIC_OR_DEPARTMENT_OTHER): Payer: Self-pay | Admitting: Physical Therapy

## 2022-03-22 DIAGNOSIS — G8929 Other chronic pain: Secondary | ICD-10-CM

## 2022-03-22 DIAGNOSIS — M6281 Muscle weakness (generalized): Secondary | ICD-10-CM

## 2022-03-22 DIAGNOSIS — R2689 Other abnormalities of gait and mobility: Secondary | ICD-10-CM

## 2022-03-22 DIAGNOSIS — M5459 Other low back pain: Secondary | ICD-10-CM

## 2022-03-22 DIAGNOSIS — M545 Low back pain, unspecified: Secondary | ICD-10-CM | POA: Diagnosis not present

## 2022-03-22 NOTE — Therapy (Signed)
OUTPATIENT PHYSICAL THERAPY THORACOLUMBAR [place]   Patient Name: Melinda Hayes MRN: [id number] DOB:Mar 23, 1955, 67 y.o., female Today's Date: 03/22/2022  END OF SESSION:  PT End of Session - 03/22/22 1048     Visit Number 2    Number of Visits 15    Date for PT Re-Evaluation 05/14/22    Authorization Type va    Authorization Time Period Department of [place]: [id number]  [facility] Contact: Birdie Riddle @ [phone]  54 visits approved from 12.05.2023 - 04.03.2024    Progress Note Due on Visit 10    PT Start Time 1033    PT Stop Time 1115    PT Time Calculation (min) 42 min    Activity Tolerance Patient tolerated treatment well    Behavior During Therapy WFL for tasks assessed/performed               Past Medical History:  Diagnosis Date   Hypertension    Rheumatoid arthritis ([place]) 2019   Past Surgical History:  Procedure Laterality Date   ABDOMINAL HYSTERECTOMY     BACK SURGERY     TOTAL KNEE ARTHROPLASTY Right 2016   Patient Active Problem List   Diagnosis Date Noted   Cervical radiculopathy 10/25/2017   Chronic midline low back pain without sciatica 10/25/2017    PCP: Myrla Halsted, MD  REFERRING PROVIDER: Myrla Halsted, MD   REFERRING DIAG: M54.50 (ICD-10-CM) - Low back pain, unspecified   Rationale for Evaluation and Treatment: M54.50 (ICD-10-CM) - Low back pain, unspecified   THERAPY DIAG:  Other low back pain  Chronic pain of left knee  Muscle weakness (generalized)  Other abnormalities of gait and mobility  ONSET DATE: >9yrs  SUBJECTIVE:                                                                                                                                                                                           SUBJECTIVE STATEMENT: "Hurting bad today"   PERTINENT HISTORY:  Morbid obesity; HTN, L TKR  PAIN:  Are you having pain? Yes: NPRS scale: 8/10 current; worst 7/10 Pain location: mid back l>R  although across center, left knee Aggravating factors: lying down; activity >1 hours back twists Relieving factors: sitting; heating pad, ice packs, meds  PRECAUTIONS: Back and Fall  WEIGHT BEARING RESTRICTIONS: No  FALLS:  Has patient fallen in last 6 months?  Pt denies  LIVING ENVIRONMENT: Lives with: lives with their family Lives in: House/apartment Stairs: No Has following equipment at home: Single point cane  OCCUPATION: retired/disabled  PLOF: Independent with household mobility with device  PATIENT GOALS: lose weight; improve ability to  walk, decrease pain  NEXT MD VISIT:   OBJECTIVE:   DIAGNOSTIC FINDINGS:  None in chart. 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 No tenderness noted.  LUMBAR ROM:  Pt limited in all Lumbar ROM by Pain: AROM eval  Flexion Finger tips below knees (~50%)  Extension 50%  Right lateral flexion 50%  Left lateral flexion 25%  Right rotation   Left rotation    (Blank rows = not tested)  LOWER EXTREMITY ROM:     Active  Right eval Left eval  Hip flexion    Hip extension    Hip abduction    Hip adduction    Hip internal rotation    Hip external rotation    Knee flexion  90  Knee extension  0  Ankle dorsiflexion    Ankle plantarflexion    Ankle inversion    Ankle eversion     (Blank rows = not tested)  LOWER EXTREMITY MMT:    MMT Right eval Left eval  Hip flexion 3+ supine 3  Hip extension    Hip abduction 5 5  Hip adduction 5 5  Hip internal rotation    Hip external rotation    Knee flexion 5 3+ P!  Knee extension 5 4-P!   Ankle dorsiflexion 5 5  Ankle plantarflexion    Ankle inversion    Ankle eversion     (Blank rows = not tested)  LUMBAR SPECIAL TESTS:  Straight leg raise test: Positive and Slump test: Negative  FUNCTIONAL TESTS:  5x Sts 31.05 armed chair Tug 33 with cane  GAIT: Distance walked: 50 Assistive device utilized: Single point cane Level of assistance: Complete Independence Comments: antalgic limb lle, cane left side, decreased stance time lle  TODAY'S TREATMENT:                                                                                                                              Pt seen for aquatic therapy today.  Treatment took place in water 3.25-4.5 ft in depth at the [facility]. Temp of water was 92.  Pt entered/exited the pool via stairs using step to pattern with bilat hand rail.  Intro to setting Walking using white barbell forward 3.6 ft x 5 Side stepping ue support on wall 3.6 through 4.2 R/L Seated on bench: LAQ R/L 2x12   -STS onto pool bottom x 10.  Cues for hand placement and weight shifting Standing ue support: 3.49ft DF; PF; hip extension; marching   - in 4.3 ft hip flex; hip flex/ext.  Hand on wall other on white barbell Backward amb using barbell 3.6 ft to 4.3  Seated on bench cycling   Pt requires the buoyancy and hydrostatic pressure of water for support, and to offload joints by unweighting joint load by at least 50 % in navel deep water and by at least 75-80% in chest to neck deep water.  Viscosity of the water is needed for resistance of strengthening. Water current perturbations provides challenge to standing balance requiring increased core activation.     PATIENT EDUCATION:  Education details: Discussed eval findings, rehab rationale and POC and patient is in agreement Person educated: Patient Education method: Explanation Education comprehension: verbalized understanding  HOME EXERCISE PROGRAM: Access Code: [email or web address] URL:  [email or web address] Date: 03/05/2022 Prepared by: Geni Bers  Exercises - Supine Quad Set  - 1 x daily - 7 x weekly - 3 sets - 10 reps - Supine Posterior Pelvic Tilt  - 1 x daily - 7 x weekly - 3 sets - 10 reps  ASSESSMENT:  CLINICAL IMPRESSION: Pt acclimates quickly to submersion being instructed by therapist on deck.  She is directed through gentle exercises and walking to allow for adaptation to properties of water.  She tolerated all very well. Given VC, demonstration and light touch for feedback and confidence.  Reduction in pain to 4/10.  She is a good candidate for aquatic therapy and will benefit from the properties of water to progress towards goals.    Initial Assessment: Patient is a 67 y.o. f who was seen today for physical therapy evaluation and treatment for LBP. She present  having used a wc from the lobby to (and from) pool as she could not tolerate distance walking ~400 ft). With testing she has decreased lumbar ROM, core strength and significant pain sx with movement although not any radicular issues. She also complains of left knee pain due to OA.  Right knee with some discomfort but not limiting. R TKR x 8 yrs ago. She is planning on undergoing a sleeve gastrectomy for weight loss in near future with BMI >45.  She will benefit from skilled physical therapy, aquatic to use the properties of water to initiate and gain strength, ROM and retrain balance and proprioception then land based when appropriate to further her improvement with functional mobility and ADL's.  OBJECTIVE IMPAIRMENTS: Abnormal gait, decreased activity tolerance, decreased balance, decreased endurance, decreased mobility, difficulty walking, decreased ROM, decreased strength, improper body mechanics, obesity, and pain.   ACTIVITY LIMITATIONS: carrying, lifting, bending, standing, squatting, stairs, transfers, and locomotion level  PARTICIPATION LIMITATIONS: meal prep, cleaning, laundry,  shopping, community activity, and yard work  PERSONAL FACTORS: Age, Behavior pattern, Fitness, and 1-2 comorbidities: morbid obesity, HTN, RA  are also affecting patient's functional outcome.   REHAB POTENTIAL: Good  CLINICAL DECISION MAKING: Evolving/moderate complexity  EVALUATION COMPLEXITY: Moderate   GOALS: Goals reviewed with patient? Yes  SHORT TERM GOALS: Target date: 04/09/22  Pt to tolerate full sessions of aquatic therapy without any increase in pain Baseline:TBA Goal status: INITIAL  2.  Pt will improve on 5 X STS test to < 25s to demonstrate improving strength, transitional  movements and balance Baseline: 5x Sts 31.05 armed chair Goal status: INITIAL  3.  Pt will use cane properly ( right hand) to decrease l knee pain and decrease LBP and improve safety/gait Baseline: Using cane in left hand (dominant) Goal status: INITIAL  4.  Pt will improve strength in all areas where there is a deficit by at  least 1/2 grade. Baseline: see chart Goal status: INITIAL   LONG TERM GOALS: Target date: 05/14/22  Pt to meet Foto goal of 58% Baseline: 44% Goal status: INITIAL  2.  Pt will improve on TUG test to <or=20s to demonstrate improve LE function, mobility and decreased fall risk Baseline: Tug 33 with cane Goal status: INITIAL  3.  Pt will report toleration to activity to be up towards 2 hour before being limited by LB pain Baseline: 1 hour Goal status: INITIAL  4.  Pt worst LBP to be on or below 4/10 to demonstrate improved management of chronic pain Baseline: 7/10 Goal status: INITIAL  5.  Pt will improve ROM of Lumbar spine in all areas by 25% to demonstrate improving function. Baseline: see chart Goal status: INITIAL    PLAN:  PT FREQUENCY: 1-2x/week  PT DURATION: 10 weeks 15 visits [facility]  PLANNED INTERVENTIONS: Therapeutic exercises, Therapeutic activity, Neuromuscular re-education, Balance training, Gait training, Patient/Family education, Self  Care, Joint mobilization, Joint manipulation, Stair training, Orthotic/Fit training, DME instructions, Aquatic Therapy, Dry Needling, Electrical stimulation, Spinal manipulation, Spinal mobilization, Cryotherapy, Moist heat, Splintting, Taping, Traction, Ultrasound, Ionotophoresis 4mg /ml Dexamethasone, Manual therapy, and Re-evaluation.  PLAN FOR NEXT SESSION: Aquatics for strengthening and ROM core and LE, balance retraining, stair climbing, proprioception retraining, toleration to activity   , PT MPT 03/22/2022, 11:24 AM

## 2022-03-24 ENCOUNTER — Encounter (HOSPITAL_BASED_OUTPATIENT_CLINIC_OR_DEPARTMENT_OTHER): Payer: Self-pay | Admitting: Physical Therapy

## 2022-03-24 ENCOUNTER — Ambulatory Visit (HOSPITAL_BASED_OUTPATIENT_CLINIC_OR_DEPARTMENT_OTHER): Payer: No Typology Code available for payment source | Admitting: Physical Therapy

## 2022-03-24 DIAGNOSIS — M545 Low back pain, unspecified: Secondary | ICD-10-CM | POA: Diagnosis not present

## 2022-03-24 DIAGNOSIS — R2689 Other abnormalities of gait and mobility: Secondary | ICD-10-CM

## 2022-03-24 DIAGNOSIS — M6281 Muscle weakness (generalized): Secondary | ICD-10-CM

## 2022-03-24 DIAGNOSIS — M5459 Other low back pain: Secondary | ICD-10-CM

## 2022-03-24 DIAGNOSIS — G8929 Other chronic pain: Secondary | ICD-10-CM

## 2022-03-24 NOTE — Therapy (Signed)
OUTPATIENT PHYSICAL THERAPY THORACOLUMBAR TREATMEANT   Patient Name: Melinda Hayes MRN: [id number] DOB:September 29, 1955, 67 y.o., female Today's Date: 03/24/2022  END OF SESSION:  PT End of Session - 03/24/22 1032     Visit Number 3    Number of Visits 15    Date for PT Re-Evaluation 05/14/22    Authorization Type va    Authorization Time Period Department of [facility]  [place]: [id number]  [facility] Contact: Brunetta Jeans @ [phone]  15 visits approved from 12.05.2023 - 04.03.2024    Progress Note Due on Visit 10    PT Start Time 1032    PT Stop Time 1111    PT Time Calculation (min) 39 min               Past Medical History:  Diagnosis Date   Hypertension    Rheumatoid arthritis (HCC) 2019   Past Surgical History:  Procedure Laterality Date   ABDOMINAL HYSTERECTOMY     BACK SURGERY     TOTAL KNEE ARTHROPLASTY Right 2016   Patient Active Problem List   Diagnosis Date Noted   Cervical radiculopathy 10/25/2017   Chronic midline low back pain without sciatica 10/25/2017    PCP: Wallace Cullens, MD  REFERRING PROVIDER: Wallace Cullens, MD   REFERRING DIAG: M54.50 (ICD-10-CM) - Low back pain, unspecified   Rationale for Evaluation and Treatment: M54.50 (ICD-10-CM) - Low back pain, unspecified   THERAPY DIAG:  Other low back pain  Chronic pain of left knee  Muscle weakness (generalized)  Other abnormalities of gait and mobility  ONSET DATE: >74yrs  SUBJECTIVE:                                                                                                                                                                                           SUBJECTIVE STATEMENT:  "I was sleepy on the drive home after last session"   "I was scared in the water, afraid I was going to fall"   PERTINENT HISTORY:  Morbid obesity; HTN, L TKR  PAIN:  Are you having pain? Yes: NPRS scale: 3/10 Pain location: lower back;  left knee and Left shoulder  Aggravating factors:  lying down; activity >1 hours back twists Relieving factors: sitting; heating pad, ice packs, meds  PRECAUTIONS: Back and Fall  WEIGHT BEARING RESTRICTIONS: No  FALLS:  Has patient fallen in last 6 months?  Pt denies  LIVING ENVIRONMENT: Lives with: lives with their family Lives in: House/apartment Stairs: No Has following equipment at home: Single point cane  OCCUPATION: retired/disabled  PLOF: Independent with household mobility with device  PATIENT GOALS: lose weight; improve ability  to walk, decrease pain  NEXT MD VISIT:   OBJECTIVE:   DIAGNOSTIC FINDINGS:  None in chart. 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 No tenderness noted.  LUMBAR ROM:  Pt limited in all Lumbar ROM by Pain: AROM eval  Flexion Finger tips below knees (~50%)  Extension 50%  Right lateral flexion 50%  Left lateral flexion 25%  Right rotation   Left rotation    (Blank rows = not tested)  LOWER EXTREMITY ROM:     Active  Right eval Left eval  Hip flexion    Hip extension    Hip abduction    Hip adduction    Hip internal rotation    Hip external rotation    Knee flexion  90  Knee extension  0  Ankle dorsiflexion    Ankle plantarflexion    Ankle inversion    Ankle eversion     (Blank rows = not tested)  LOWER EXTREMITY MMT:    MMT Right eval Left eval  Hip flexion 3+ supine 3  Hip extension    Hip abduction 5 5  Hip adduction 5 5  Hip internal rotation    Hip external rotation    Knee flexion 5 3+ P!  Knee extension 5 4-P!  Ankle dorsiflexion 5 5  Ankle plantarflexion     Ankle inversion    Ankle eversion     (Blank rows = not tested)  LUMBAR SPECIAL TESTS:  Straight leg raise test: Positive and Slump test: Negative  FUNCTIONAL TESTS:  5x Sts 31.05 armed chair Tug 33 with cane  GAIT: Distance walked: 50 Assistive device utilized: Single point cane Level of assistance: Complete Independence Comments: antalgic limb lle, cane left side, decreased stance time lle  TODAY'S TREATMENT:                                                                                                                              Pt seen for aquatic therapy today.  Treatment took place in water 3.25-3.75 ft in depth at the [place]. Temp of water was 92.  Pt entered/exited the pool via stairs using step-to pattern with bilat hand rail.  Holding wall:  side stepping R/L;  heel raises x 12; hip ext to toe touch x 10 each;  Walking using white barbell forward/ backward with cues for even step length  Holding wall: hip abdct/ addct x 10 each Holding barbell in staggered stance and row motion x 10 L stretch at rails At bench with feet on ground -STS wth  white barbell, cues for weight shift and hip hinge   Pt requires the buoyancy and hydrostatic pressure of water for support, and to offload joints by unweighting joint load by at least 50 % in navel deep water and by at least 75-80% in chest to neck deep water.  Viscosity of the water is needed for resistance of strengthening. Water current perturbations provides challenge to standing balance requiring increased core activation.     PATIENT EDUCATION:  Education details: Discussed eval findings, rehab rationale and POC and patient is in agreement Person educated: Patient Education method: Explanation Education comprehension: verbalized understanding  HOME EXERCISE PROGRAM: Access Code: [email or web address] URL: [email or web address] Date: 03/05/2022 Prepared by: Denton Meek  Exercises - Supine Quad  Set  - 1 x daily - 7 x weekly - 3 sets - 10 reps - Supine Posterior Pelvic Tilt  - 1 x daily - 7 x weekly - 3 sets - 10 reps  ASSESSMENT:  CLINICAL IMPRESSION: Pt voiced at beginning of session that she is fearful of falling / losing balance in water; afraid of drowning.  Therapist in water with her entire session with SBA.  She is guarded with gait forward and backward holding white barbell despite CGA/SBA from therapist, and she is more relaxed with holding the wall.  She requires moderate cues to bear more weight into RLE and increase her Lt step length; plan to load the Rt leg and give left leg more break to reduce pain.   She is a good candidate for aquatic therapy and will benefit from the properties of water to progress towards goals.    Initial Assessment: Patient is a 67 y.o. f who was seen today for physical therapy evaluation and treatment for LBP. She present  having used a wc from the lobby to (and from) pool as she could not tolerate distance walking ~400 ft). With testing she has decreased lumbar ROM, core strength and significant pain sx with movement although not any radicular issues. She also complains of left knee pain due to OA.  Right knee with some discomfort but not limiting. R TKR x 8 yrs ago. She is planning on undergoing a sleeve gastrectomy for weight loss in near future with BMI >45.  She will benefit from skilled physical therapy, aquatic to use the properties of water to initiate and gain strength, ROM and retrain balance and proprioception then land based when appropriate to further her improvement with functional mobility and ADL's.  OBJECTIVE IMPAIRMENTS: Abnormal gait, decreased activity tolerance, decreased balance, decreased endurance, decreased mobility, difficulty walking, decreased ROM, decreased strength, improper body mechanics, obesity, and pain.   ACTIVITY LIMITATIONS: carrying, lifting, bending, standing, squatting, stairs, transfers, and locomotion  level  PARTICIPATION LIMITATIONS: meal prep, cleaning, laundry, shopping, community activity, and yard work  PERSONAL FACTORS: Age, Behavior pattern, Fitness, and 1-2 comorbidities: morbid obesity, HTN, RA  are also affecting patient's functional outcome.   REHAB POTENTIAL: Good  CLINICAL DECISION MAKING: Evolving/moderate complexity  EVALUATION COMPLEXITY: Moderate   GOALS: Goals reviewed with patient? Yes  SHORT TERM GOALS: Target date: 04/09/22  Pt to tolerate full sessions of aquatic therapy without any increase in pain Baseline:TBA Goal status: INITIAL  2.  Pt will improve on 5 X STS test to < 25s to demonstrate improving strength, transitional  movements and balance Baseline: 5x Sts 31.05 armed chair Goal status: INITIAL  3.  Pt will use cane properly ( right hand) to decrease l knee pain and decrease LBP and improve safety/gait  Baseline: Using cane in left hand (dominant) Goal status: INITIAL  4.  Pt will improve strength in all areas where there is a deficit by at least 1/2 grade. Baseline: see chart Goal status: INITIAL   LONG TERM GOALS: Target date: 05/14/22  Pt to meet Foto goal of 58% Baseline: 44% Goal status: INITIAL  2.  Pt will improve on TUG test to <or=20s to demonstrate improve LE function, mobility and decreased fall risk Baseline: Tug 33 with cane Goal status: INITIAL  3.  Pt will report toleration to activity to be up towards 2 hour before being limited by LB pain Baseline: 1 hour Goal status: INITIAL  4.  Pt worst LBP to be on or below 4/10 to demonstrate improved management of chronic pain Baseline: 7/10 Goal status: INITIAL  5.  Pt will improve ROM of Lumbar spine in all areas by 25% to demonstrate improving function. Baseline: see chart Goal status: INITIAL    PLAN:  PT FREQUENCY: 1-2x/week  PT DURATION: 10 weeks 15 [facility] per [facility]  PLANNED INTERVENTIONS: Therapeutic exercises, Therapeutic activity, Neuromuscular re-education,  Balance training, Gait training, Patient/Family education, Self Care, Joint mobilization, Joint manipulation, Stair training, Orthotic/Fit training, DME instructions, Aquatic Therapy, Dry Needling, Electrical stimulation, Spinal manipulation, Spinal mobilization, Cryotherapy, Moist heat, Splintting, Taping, Traction, Ultrasound, Ionotophoresis 4mg /ml Dexamethasone, Manual therapy, and Re-evaluation.  PLAN FOR NEXT SESSION: Aquatics for strengthening and ROM core and LE, balance retraining, stair climbing, proprioception retraining, toleration to activity , PTA 03/24/22 2:12 PM [facility] [address] [place], [place], [place] Phone: [phone]   Fax:  [phone]

## 2022-03-28 NOTE — Therapy (Signed)
OUTPATIENT PHYSICAL THERAPY THORACOLUMBAR TREATMEANT   Patient Name: Melinda Hayes MRN: [id number] DOB:04-26-1955, 67 y.o., female Today's Date: 03/29/2022  END OF SESSION:  PT End of Session - 03/29/22 1216     Visit Number 4    Number of Visits 15    Date for PT Re-Evaluation 05/14/22    Authorization Type va    Authorization Time Period Department of [facility]  [place]: [id number]  [facility] Contact: Brunetta Jeans @ [phone]  15 visits approved from 12.05.2023 - 04.03.2024    Progress Note Due on Visit 10    PT Start Time 1119    PT Stop Time 1200    PT Time Calculation (min) 41 min    Activity Tolerance Patient limited by pain    Behavior During Therapy [facility] for tasks assessed/performed               Past Medical History:  Diagnosis Date   Hypertension    Rheumatoid arthritis (HCC) 2019   Past Surgical History:  Procedure Laterality Date   ABDOMINAL HYSTERECTOMY     BACK SURGERY     TOTAL KNEE ARTHROPLASTY Right 2016   Patient Active Problem List   Diagnosis Date Noted   Cervical radiculopathy 10/25/2017   Chronic midline low back pain without sciatica 10/25/2017    PCP: Wallace Cullens, MD  REFERRING PROVIDER: Wallace Cullens, MD   REFERRING DIAG: M54.50 (ICD-10-CM) - Low back pain, unspecified   Rationale for Evaluation and Treatment: M54.50 (ICD-10-CM) - Low back pain, unspecified   THERAPY DIAG:  Other low back pain  Chronic pain of left knee  Muscle weakness (generalized)  Other abnormalities of gait and mobility  ONSET DATE: >27yrs  SUBJECTIVE:                                                                                                                                                                                           SUBJECTIVE STATEMENT:  "I don't know what is hurting me worse my left arm or leg. Maybe I slept funny"   PERTINENT HISTORY:  Morbid obesity; HTN, L TKR  PAIN:  Are you having pain? Yes: NPRS scale:  6/10 Pain location: lower back;  left knee and Left shoulder  Aggravating factors: lying down; activity >1 hours back twists Relieving factors: sitting; heating pad, ice packs, meds  PRECAUTIONS: Back and Fall  WEIGHT BEARING RESTRICTIONS: No  FALLS:  Has patient fallen in last 6 months?  Pt denies  LIVING ENVIRONMENT: Lives with: lives with their family Lives in: House/apartment Stairs: No Has following equipment at home: Single point cane  OCCUPATION: retired/disabled  PLOF: Independent  with household mobility with device  PATIENT GOALS: lose weight; improve ability to walk, decrease pain  NEXT MD VISIT:   OBJECTIVE:   DIAGNOSTIC FINDINGS:  None in chart. 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 No tenderness noted.  LUMBAR ROM:  Pt limited in all Lumbar ROM by Pain: AROM eval  Flexion Finger tips below knees (~50%)  Extension 50%  Right lateral flexion 50%  Left lateral flexion 25%  Right rotation   Left rotation    (Blank rows = not tested)  LOWER EXTREMITY ROM:     Active  Right eval Left eval  Hip flexion    Hip extension    Hip abduction    Hip adduction    Hip internal rotation    Hip external rotation    Knee flexion  90  Knee extension  0  Ankle dorsiflexion    Ankle plantarflexion    Ankle inversion    Ankle eversion     (Blank rows = not tested)  LOWER EXTREMITY MMT:    MMT Right eval Left eval  Hip flexion 3+ supine 3  Hip extension    Hip abduction 5 5  Hip adduction 5 5  Hip internal rotation    Hip external rotation    Knee flexion 5  3+ P!  Knee extension 5 4-P!  Ankle dorsiflexion 5 5  Ankle plantarflexion    Ankle inversion    Ankle eversion     (Blank rows = not tested)  LUMBAR SPECIAL TESTS:  Straight leg raise test: Positive and Slump test: Negative  FUNCTIONAL TESTS:  5x Sts 31.05 armed chair Tug 33 with cane  GAIT: Distance walked: 50 Assistive device utilized: Single point cane Level of assistance: Complete Independence Comments: antalgic limb lle, cane left side, decreased stance time lle  TODAY'S TREATMENT:                                                                                                                              Pt seen for aquatic therapy today.  Treatment took place in water 3.25-3.75 ft in depth at the [place]. Temp of water was 92.  Pt entered/exited the pool via stairs using step-to pattern with bilat hand rail.   *Walking forward ue support white barbell *Leaning on wall: left shoulder add/abd and flex *Holding wall hand in gutter: Toe raises; heel raises; add/abd; hip extension; mini squats x15   - cervical retraction; rotation and side bending.  Slight over pressure given. Shoulder retraction and depression *Seated on lift:  LAQ; left shoulder circle cw and ccw,; shoulder extension; elbow flex/ext   -LE add/abd, cycling; flutter kick *Using white barbell sidestepping x 2 widths     Pt requires the buoyancy and hydrostatic pressure of water for support, and to offload joints by unweighting joint load by at least 50 % in navel deep water and by at least 75-80% in chest to neck deep water.  Viscosity of the water is needed for resistance of strengthening. Water current perturbations provides challenge to standing balance requiring increased core activation.     PATIENT EDUCATION:  Education details: Discussed eval findings, rehab rationale and POC and patient is in agreement Person educated: Patient Education method: Explanation Education comprehension:  verbalized understanding  HOME EXERCISE PROGRAM: Access Code: [email or web address] URL: [email or web address] Date: 03/05/2022 Prepared by: Denton Meek  Exercises - Supine Quad Set  - 1 x daily - 7 x weekly - 3 sets - 10 reps - Supine Posterior Pelvic Tilt  - 1 x daily - 7 x weekly - 3 sets - 10 reps  ASSESSMENT:  CLINICAL IMPRESSION: Pt limited by pain today combination left ue and left knee with LUE >Left knee. Gentle ROM shoulder and cervical spine completed to relieve discomfort as tolerated. Reduction in knee and arm pain at end of session.  She does demonstrate improvement with balance submerged completing session without LOB and with therapist instructing from deck. Goals ongoing      Initial Assessment: Patient is a 67 y.o. f who was seen today for physical therapy evaluation and treatment for LBP. She present  having used a wc from the lobby to (and from) pool as she could not tolerate distance walking ~400 ft). With testing she has decreased lumbar ROM, core strength and significant pain sx with movement although not any radicular issues. She also complains of left knee pain due to OA.  Right knee with some discomfort but not limiting. R TKR x 8 yrs ago. She is planning on undergoing a sleeve gastrectomy for weight loss in near future with BMI >45.  She will benefit from skilled physical therapy, aquatic to use the properties of water to initiate and gain strength, ROM and retrain balance and proprioception then land based when appropriate to further her improvement with functional mobility and ADL's.  OBJECTIVE IMPAIRMENTS: Abnormal gait, decreased activity tolerance, decreased balance, decreased endurance, decreased mobility, difficulty walking, decreased ROM, decreased strength, improper body mechanics, obesity, and pain.   ACTIVITY LIMITATIONS: carrying, lifting, bending, standing, squatting, stairs, transfers, and locomotion level  PARTICIPATION LIMITATIONS: meal prep,  cleaning, laundry, shopping, community activity, and yard work  PERSONAL FACTORS: Age, Behavior pattern, Fitness, and 1-2 comorbidities: morbid obesity, HTN, RA  are also affecting patient's functional outcome.   REHAB POTENTIAL: Good  CLINICAL DECISION MAKING: Evolving/moderate complexity  EVALUATION COMPLEXITY: Moderate   GOALS: Goals reviewed with patient? Yes  SHORT TERM GOALS: Target date: 04/09/22  Pt to tolerate full sessions of aquatic therapy without any increase in pain Baseline:TBA Goal status: INITIAL  2.  Pt will improve on 5 X STS test to < 25s to demonstrate improving strength, transitional  movements and balance Baseline: 5x Sts 31.05 armed chair Goal status: INITIAL  3.  Pt will use cane properly ( right hand) to decrease l knee pain and decrease LBP and improve safety/gait Baseline: Using cane in left hand (dominant) Goal status: INITIAL  4.  Pt will improve strength in all areas where there is a deficit by at least 1/2 grade. Baseline: see  chart Goal status: INITIAL   LONG TERM GOALS: Target date: 05/14/22  Pt to meet Foto goal of 58% Baseline: 44% Goal status: INITIAL  2.  Pt will improve on TUG test to <or=20s to demonstrate improve LE function, mobility and decreased fall risk Baseline: Tug 33 with cane Goal status: INITIAL  3.  Pt will report toleration to activity to be up towards 2 hour before being limited by LB pain Baseline: 1 hour Goal status: INITIAL  4.  Pt worst LBP to be on or below 4/10 to demonstrate improved management of chronic pain Baseline: 7/10 Goal status: INITIAL  5.  Pt will improve ROM of Lumbar spine in all areas by 25% to demonstrate improving function. Baseline: see chart Goal status: INITIAL    PLAN:  PT FREQUENCY: 1-2x/week  PT DURATION: 10 weeks [facility] per [facility]  PLANNED INTERVENTIONS: Therapeutic exercises, Therapeutic activity, Neuromuscular re-education, Balance training, Gait training, Patient/Family  education, Self Care, Joint mobilization, Joint manipulation, Stair training, Orthotic/Fit training, DME instructions, Aquatic Therapy, Dry Needling, Electrical stimulation, Spinal manipulation, Spinal mobilization, Cryotherapy, Moist heat, Splintting, Taping, Traction, Ultrasound, Ionotophoresis 4mg /ml Dexamethasone, Manual therapy, and Re-evaluation.  PLAN FOR NEXT SESSION: Aquatics for strengthening and ROM core and LE, balance retraining, stair climbing, proprioception retraining, toleration to activity  Melinda Hayes MPT 03/29/22 12:18 PM [facility] [address] [place], [place], [address] Phone: [phone]   Fax:  [phone]

## 2022-03-29 ENCOUNTER — Encounter (HOSPITAL_BASED_OUTPATIENT_CLINIC_OR_DEPARTMENT_OTHER): Payer: Self-pay | Admitting: Physical Therapy

## 2022-03-29 ENCOUNTER — Ambulatory Visit (HOSPITAL_BASED_OUTPATIENT_CLINIC_OR_DEPARTMENT_OTHER): Payer: No Typology Code available for payment source | Admitting: Physical Therapy

## 2022-03-29 DIAGNOSIS — G8929 Other chronic pain: Secondary | ICD-10-CM

## 2022-03-29 DIAGNOSIS — M6281 Muscle weakness (generalized): Secondary | ICD-10-CM

## 2022-03-29 DIAGNOSIS — M545 Low back pain, unspecified: Secondary | ICD-10-CM | POA: Diagnosis not present

## 2022-03-29 DIAGNOSIS — M5459 Other low back pain: Secondary | ICD-10-CM

## 2022-03-29 DIAGNOSIS — R2689 Other abnormalities of gait and mobility: Secondary | ICD-10-CM

## 2022-03-31 ENCOUNTER — Encounter (HOSPITAL_BASED_OUTPATIENT_CLINIC_OR_DEPARTMENT_OTHER): Payer: Self-pay | Admitting: Physical Therapy

## 2022-03-31 ENCOUNTER — Ambulatory Visit (HOSPITAL_BASED_OUTPATIENT_CLINIC_OR_DEPARTMENT_OTHER): Payer: No Typology Code available for payment source | Admitting: Physical Therapy

## 2022-03-31 DIAGNOSIS — G8929 Other chronic pain: Secondary | ICD-10-CM

## 2022-03-31 DIAGNOSIS — M6281 Muscle weakness (generalized): Secondary | ICD-10-CM

## 2022-03-31 DIAGNOSIS — M545 Low back pain, unspecified: Secondary | ICD-10-CM | POA: Diagnosis not present

## 2022-03-31 DIAGNOSIS — M5459 Other low back pain: Secondary | ICD-10-CM

## 2022-03-31 DIAGNOSIS — R2689 Other abnormalities of gait and mobility: Secondary | ICD-10-CM

## 2022-03-31 NOTE — Therapy (Signed)
OUTPATIENT PHYSICAL THERAPY THORACOLUMBAR [place]   Patient Name: Melinda Hayes MRN: [id number] DOB:[birth year], 67 y.o., female Today's Date: 03/31/2022  END OF SESSION:  PT End of Session - 03/31/22 1117     Visit Number 5    Number of Visits 15    Date for PT Re-Evaluation 05/14/22    [facility] Type [facility]    Authorization Time Period Department of [place]: [id number]  [facility] Contact: Birdie Riddle @ [phone]  28 visits approved from 12.05.2023 - 04.03.2024    Progress Note Due on Visit 10    PT Start Time 1116    PT Stop Time 1155    PT Time Calculation (min) 39 min    Activity Tolerance Patient limited by pain    Behavior During Therapy [facility] for tasks assessed/performed               Past Medical History:  Diagnosis Date   Hypertension    Rheumatoid arthritis ([place]) 2019   Past Surgical History:  Procedure Laterality Date   ABDOMINAL HYSTERECTOMY     BACK SURGERY     TOTAL KNEE ARTHROPLASTY Right 2016   Patient Active Problem List   Diagnosis Date Noted   Cervical radiculopathy 10/25/2017   Chronic midline low back pain without sciatica 10/25/2017    PCP: Myrla Halsted, MD  REFERRING PROVIDER: Myrla Halsted, MD   REFERRING DIAG: M54.50 (ICD-10-CM) - Low back pain, unspecified   Rationale for Evaluation and Treatment: M54.50 (ICD-10-CM) - Low back pain, unspecified   THERAPY DIAG:  Other low back pain  Chronic pain of left knee  Muscle weakness (generalized)  Other abnormalities of gait and mobility  ONSET DATE: >42yrs  SUBJECTIVE:                                                                                                                                                                                           SUBJECTIVE STATEMENT: Pt reports her L arm is still painful. She has an appt at [facility] tomorrow to have it checked out.  She has been taking ibuprofen for pain since Monday.  Back and knee have not been painful  since the shoulder started hurting.  PERTINENT HISTORY:  Morbid obesity; HTN, L TKR  PAIN:  Are you having pain? Yes: NPRS scale: 6/10 Pain location: Lt arm to wrist  Aggravating factors: use of LUE Relieving factors: sitting;  meds  PRECAUTIONS: Back and Fall  WEIGHT BEARING RESTRICTIONS: No  FALLS:  Has patient fallen in last 6 months?  Pt denies  LIVING ENVIRONMENT: Lives with: lives with their family Lives in: House/apartment Stairs: No Has  following equipment at home: Single point cane  OCCUPATION: retired/disabled  PLOF: Independent with household mobility with device  PATIENT GOALS: lose weight; improve ability to walk, decrease pain  NEXT MD VISIT:   OBJECTIVE:   DIAGNOSTIC FINDINGS:  None in chart. 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 No tenderness noted.  LUMBAR ROM:  Pt limited in all Lumbar ROM by Pain: AROM eval  Flexion Finger tips below knees (~50%)  Extension 50%  Right lateral flexion 50%  Left lateral flexion 25%  Right rotation   Left rotation    (Blank rows = not tested)  LOWER EXTREMITY ROM:     Active  Right eval Left eval  Hip flexion    Hip extension    Hip abduction    Hip adduction    Hip internal rotation    Hip external rotation    Knee flexion  90  Knee extension  0  Ankle dorsiflexion    Ankle plantarflexion    Ankle inversion    Ankle eversion     (Blank rows = not tested)  LOWER EXTREMITY MMT:    MMT Right eval Left eval  Hip flexion 3+ supine 3  Hip extension    Hip abduction 5 5  Hip adduction 5 5  Hip  internal rotation    Hip external rotation    Knee flexion 5 3+ P!  Knee extension 5 4-P!  Ankle dorsiflexion 5 5  Ankle plantarflexion    Ankle inversion    Ankle eversion     (Blank rows = not tested)  LUMBAR SPECIAL TESTS:  Straight leg raise test: Positive and Slump test: Negative  FUNCTIONAL TESTS:  5x Sts 31.05 armed chair Tug 33 with cane  GAIT: Distance walked: 50 Assistive device utilized: Single point cane Level of assistance: Complete Independence Comments: antalgic limb lle, cane left side, decreased stance time lle  TODAY'S TREATMENT:                                                                                                                              Pt seen for aquatic therapy today.  Treatment took place in water 3.25-3.75 ft in depth at the [facility] pool. Temp of water was 92.  Pt entered/exited the pool via stairs using step-to pattern with bilat hand rail.   *Walking forward/backward  UE support white barbell  * holding wall:  hip abdct x 10; hip ext x 10; heel raises x 10; squats (slow and relaxed) x 10  * UE on white barbell: side  stepping R/L 2 laps   * row motion with hands on barbell, with scap retraction x 4 (painful in LUE-stopped)  * lateral cervical flexion R/L (painful)  * cervical rotation L/R, then Rt rotation with head nod x 5 (Lt rotation painful)  * Lt shoulder pendulum in water; bilat shoulder circle CW/CCW; bilat shoulder ext x 5  * seated at bench: cycling, alternating LAQ with DF, hip abdct/ addct, elbow flex/ext, cycling, flutter kick  * at bench in water, with blue step under feet:  STS with forward arm reach x 6  * at stairs:  L/R hamstring stretch with foot on 2nd step x 15s x 2, cues for alignment and form   Pt requires the buoyancy and hydrostatic pressure of water for support, and to offload joints by unweighting joint load by at least 50 % in navel deep water and by at least 75-80% in chest to neck deep water.   Viscosity of the water is needed for resistance of strengthening. Water current perturbations provides challenge to standing balance requiring increased core activation.     PATIENT EDUCATION:  Education details: Aquatics progressions and modifications Person educated: Patient Education method: Explanation Education comprehension: verbalized understanding  HOME EXERCISE PROGRAM: Access Code: [email or web address] URL: [email or web address] Date: 03/05/2022 Prepared by: Denton Meek  Exercises - Supine Quad Set  - 1 x daily - 7 x weekly - 3 sets - 10 reps - Supine Posterior Pelvic Tilt  - 1 x daily - 7 x weekly - 3 sets - 10 reps  ASSESSMENT:  CLINICAL IMPRESSION: Pt is now ambulating from lobby to pool with SPC (was using WC transport for eval).  Pt continues to be limited by pain in left arm; limited ability to hold floatation device with Lt hand due to increased pain in Lt shoulder and wrist when arm is not down by her side. Gentle ROM shoulder and cervical spine completed to relieve discomfort as tolerated. She is slightly guarded with backward gait and side stepping, but otherwise does well following instruction from therapist on deck.  She reports no back or Lt knee pain while exercising in the water.  Progressing towards goals.  She will benefit from skilled physical therapy, aquatic to use the properties of water to initiate and gain strength, ROM and retrain balance and proprioception then land based when appropriate to further her improvement with functional mobility and ADL's.  OBJECTIVE IMPAIRMENTS: Abnormal gait, decreased activity tolerance, decreased balance, decreased endurance, decreased mobility, difficulty walking, decreased ROM, decreased strength, improper body mechanics, obesity, and pain.   ACTIVITY LIMITATIONS: carrying, lifting, bending, standing, squatting, stairs, transfers, and locomotion level  PARTICIPATION LIMITATIONS: meal prep, cleaning, laundry,  shopping, community activity, and yard work  PERSONAL FACTORS: Age, Behavior pattern, Fitness, and 1-2 comorbidities: morbid obesity, HTN, RA  are also affecting patient's functional outcome.   REHAB POTENTIAL: Good  CLINICAL DECISION MAKING: Evolving/moderate complexity  EVALUATION COMPLEXITY: Moderate   GOALS: Goals reviewed with patient? Yes  SHORT TERM GOALS: Target date: 04/09/22  Pt to tolerate full sessions of aquatic therapy without any increase in pain Baseline:TBA Goal status: INITIAL  2.  Pt will improve on 5 X STS test to < 25s to demonstrate improving strength, transitional  movements and balance Baseline: 5x Sts 31.05 armed chair Goal status: INITIAL  3.  Pt will use cane properly ( right hand) to decrease l knee pain and decrease LBP and improve safety/gait Baseline: Using cane in left hand (dominant) Goal status: INITIAL  4.  Pt will improve strength in all areas where there is a deficit by at least 1/2 grade. Baseline: see chart Goal status: INITIAL   LONG TERM GOALS: Target date: 05/14/22  Pt to meet Foto goal of 58% Baseline: 44% Goal status: INITIAL  2.  Pt will improve on TUG test to <or=20s to demonstrate improve LE function, mobility and decreased fall risk Baseline: Tug 33 with cane Goal status: INITIAL  3.  Pt will report toleration to activity to be up towards 2 hour before being limited by LB pain Baseline: 1 hour Goal status: INITIAL  4.  Pt worst LBP to be on or below 4/10 to demonstrate improved management of chronic pain Baseline: 7/10 Goal status: INITIAL  5.  Pt will improve ROM of Lumbar spine in all areas by 25% to demonstrate improving function. Baseline: see chart Goal status: INITIAL    PLAN:  PT FREQUENCY: 1-2x/week  PT DURATION: 10 weeks [facility]  PLANNED INTERVENTIONS: Therapeutic exercises, Therapeutic activity, Neuromuscular re-education, Balance training, Gait training, Patient/Family education, Self  Care, Joint mobilization, Joint manipulation, Stair training, Orthotic/Fit training, DME instructions, Aquatic Therapy, Dry Needling, Electrical stimulation, Spinal manipulation, Spinal mobilization, Cryotherapy, Moist heat, Splintting, Taping, Traction, Ultrasound, Ionotophoresis 4mg /ml Dexamethasone, Manual therapy, and Re-evaluation.  PLAN FOR NEXT SESSION: Aquatics for strengthening and ROM core and LE, balance retraining, stair climbing, proprioception retraining, toleration to activity  Kerin Perna, PTA 03/31/22 11:58 AM [place] [address] [place], [place], [address] Phone: [phone]   Fax:  [phone]

## 2022-04-06 ENCOUNTER — Encounter (HOSPITAL_BASED_OUTPATIENT_CLINIC_OR_DEPARTMENT_OTHER): Payer: Self-pay | Admitting: Physical Therapy

## 2022-04-06 ENCOUNTER — Encounter (HOSPITAL_COMMUNITY): Payer: Self-pay | Admitting: Surgery

## 2022-04-06 ENCOUNTER — Ambulatory Visit (HOSPITAL_BASED_OUTPATIENT_CLINIC_OR_DEPARTMENT_OTHER): Payer: No Typology Code available for payment source | Attending: Family Medicine | Admitting: Physical Therapy

## 2022-04-06 DIAGNOSIS — R2689 Other abnormalities of gait and mobility: Secondary | ICD-10-CM | POA: Diagnosis present

## 2022-04-06 DIAGNOSIS — M25562 Pain in left knee: Secondary | ICD-10-CM | POA: Diagnosis present

## 2022-04-06 DIAGNOSIS — M5459 Other low back pain: Secondary | ICD-10-CM | POA: Diagnosis not present

## 2022-04-06 DIAGNOSIS — G8929 Other chronic pain: Secondary | ICD-10-CM | POA: Diagnosis present

## 2022-04-06 DIAGNOSIS — M6281 Muscle weakness (generalized): Secondary | ICD-10-CM | POA: Insufficient documentation

## 2022-04-06 NOTE — Therapy (Signed)
OUTPATIENT PHYSICAL THERAPY THORACOLUMBAR [place]   Patient Name: Melinda Hayes MRN: [id number] DOB:Apr 06, 1955, 67 y.o., female Today's Date: 04/06/2022  END OF SESSION:  PT End of Session - 04/06/22 1056     Visit Number 6    Number of Visits 15    Date for PT Re-Evaluation 05/14/22    [facility]    Authorization Time Period Department of [place]: [id number]  [facility] Contact: Birdie Riddle @ [phone]  64 visits approved from 12.05.2023 - 04.03.2024    Progress Note Due on Visit 10    PT Start Time 1031    PT Stop Time 1115    PT Time Calculation (min) 44 min    Activity Tolerance Patient limited by pain    Behavior During Therapy [facility] for tasks assessed/performed                Past Medical History:  Diagnosis Date   Hypertension    Rheumatoid arthritis ([place]) 2019   Past Surgical History:  Procedure Laterality Date   ABDOMINAL HYSTERECTOMY     BACK SURGERY     TOTAL KNEE ARTHROPLASTY Right 2016   Patient Active Problem List   Diagnosis Date Noted   Cervical radiculopathy 10/25/2017   Chronic midline low back pain without sciatica 10/25/2017    PCP: Myrla Halsted, MD  REFERRING PROVIDER: Myrla Halsted, MD   REFERRING DIAG: M54.50 (ICD-10-CM) - Low back pain, unspecified   Rationale for Evaluation and Treatment: M54.50 (ICD-10-CM) - Low back pain, unspecified   THERAPY DIAG:  Other low back pain  Chronic pain of left knee  Muscle weakness (generalized)  ONSET DATE: >56yrs  SUBJECTIVE:                                                                                                                                                                                           SUBJECTIVE STATEMENT:  Pt reports she received a pain injection as well [facility]  Started steriod pack 2 days ago.  Back is much better falres to 6/10 after standing >1 hour takes 3o min to  decrease  PERTINENT HISTORY:  Morbid obesity; HTN, L TKR  PAIN:  Are you having pain? Yes: NPRS scale: 2-3/10 Pain location: Lt arm to wrist  Aggravating factors: use of LUE Relieving factors: sitting;  meds  PRECAUTIONS: Back and Fall  WEIGHT BEARING RESTRICTIONS: No  FALLS:  Has patient fallen in last 6 months?  Pt denies  LIVING ENVIRONMENT: Lives with: lives with their family Lives in: House/apartment Stairs: No Has following equipment at home: Single  point cane  OCCUPATION: retired/disabled  PLOF: Independent with household mobility with device  PATIENT GOALS: lose weight; improve ability to walk, decrease pain  NEXT MD VISIT:   OBJECTIVE:   DIAGNOSTIC FINDINGS:  None in chart. 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 No tenderness noted.  LUMBAR ROM:  Pt limited in all Lumbar ROM by Pain: AROM eval  Flexion Finger tips below knees (~50%)  Extension 50%  Right lateral flexion 50%  Left lateral flexion 25%  Right rotation   Left rotation    (Blank rows = not tested)  LOWER EXTREMITY ROM:     Active  Right eval Left eval  Hip flexion    Hip extension    Hip abduction    Hip adduction    Hip internal rotation    Hip external rotation    Knee flexion  90  Knee extension  0  Ankle dorsiflexion    Ankle plantarflexion    Ankle inversion    Ankle eversion     (Blank rows = not tested)  LOWER EXTREMITY MMT:    MMT Right eval Left eval Right / Left 04-06-22  Hip flexion 3+ supine 3 4- / 4  Hip extension     Hip abduction 5 5   Hip adduction  5 5   Hip internal rotation     Hip external rotation     Knee flexion 5 3+ P! 4-/5 P!  Knee extension 5 4-P! 4+ P!  Ankle dorsiflexion 5 5   Ankle plantarflexion     Ankle inversion     Ankle eversion      (Blank rows = not tested)  LUMBAR SPECIAL TESTS:  Straight leg raise test: Positive and Slump test: Negative  FUNCTIONAL TESTS:  5x Sts 31.05 armed chair Tug 33 with cane  04/06/22 5x STS 26s  GAIT: Distance walked: 50 Assistive device utilized: Single point cane Level of assistance: Complete Independence Comments: antalgic limb lle, cane left side, decreased stance time lle  TODAY'S TREATMENT:                                                                                                                              Pt seen for aquatic therapy today.  Treatment took place in water 3.25-3.75 ft in depth at the [facility] pool. Temp of water was 92.  Pt entered/exited the pool via stairs using step-to pattern with bilat hand rail.   *Walking forward/backward  UE support white barbell  * UE on white barbell: side stepping  R/L 2 laps   *cervical ROM and stretching all planes: with lue in int rotation/behind back  *Shoulder stretching: scapular depression and retraction; shoulder shrugging * Lt shoulder pendulum in water; Shoulder add/abd * seated at bench: cycling, alternating flutter and add/abd   Pt requires the buoyancy and hydrostatic pressure of water for support, and to offload joints by unweighting joint load by at least 50 % in navel deep water and by at least 75-80% in chest to neck deep water.  Viscosity of the water is needed for resistance of strengthening. Water current perturbations provides challenge to standing balance requiring increased core activation.  Objective testing    PATIENT EDUCATION:  Education details: Aquatics progressions and modifications Person educated: Patient Education method: Explanation Education comprehension: verbalized  understanding  HOME EXERCISE PROGRAM: Access Code: [email or web address] URL: [email or web address] Date: 03/05/2022 Prepared by: Denton Meek  Exercises - Supine Quad Set  - 1 x daily - 7 x weekly - 3 sets - 10 reps - Supine Posterior Pelvic Tilt  - 1 x daily - 7 x weekly - 3 sets - 10 reps  ASSESSMENT:  CLINICAL IMPRESSION: On streroid dose pack for [facility]  Has had some reduction in pain (from 8/10 constant to 3/10 intermittent).  Intermittent leu pain with any movement ~> 50d of shoulder flex or abd which resolves immediatley with neutral.  She has had a reduction in LBP and has improvement with toleration to amb as well as standing. She amb to and from pool setting using cane consistently in RUE as instructed. She report improvement in pain after aquatic sessions consistently. She has improved on 5 x STS test but has not yet reached STG.  Overall she has progressed well meeting 3-4 STG. She will continue to benefit from skilled physical therapy, aquatic to use the properties of water to initiate and gain strength, ROM and retrain balance and proprioception then land based when appropriate to further her improvement with functional mobility and ADL's. Will address progression to land based therpay next visit.  OBJECTIVE IMPAIRMENTS: Abnormal gait, decreased activity tolerance, decreased balance, decreased endurance, decreased mobility, difficulty walking, decreased ROM, decreased strength, improper body mechanics, obesity, and pain.   ACTIVITY LIMITATIONS: carrying, lifting, bending, standing, squatting, stairs, transfers, and locomotion level  PARTICIPATION LIMITATIONS: meal prep, cleaning, laundry, shopping, community activity, and yard work  PERSONAL FACTORS: Age, Behavior pattern, Fitness, and 1-2 comorbidities: morbid obesity, HTN, RA  are also affecting patient's functional outcome.   REHAB POTENTIAL: Good  CLINICAL DECISION MAKING:  Evolving/moderate complexity  EVALUATION COMPLEXITY: Moderate   GOALS: Goals reviewed with patient? Yes  SHORT TERM GOALS: Target date: 04/09/22  Pt to tolerate full sessions of aquatic therapy without any increase in pain Baseline:TBA Goal status: Met 04/06/22  2.  Pt will improve on 5 X STS test to < 25s to demonstrate improving strength, transitional  movements and balance Baseline: 5x Sts 31.05 armed chair Goal status: Ongoing (26s) 04/06/22  3.  Pt will use cane properly ( right hand) to decrease l knee pain and decrease LBP and improve safety/gait Baseline: Using cane in left hand (dominant) Goal status: Met 04/06/22  4.  Pt will improve strength in all areas where there is a deficit by at least 1/2 grade. Baseline: see chart Goal status: Met 04/06/22   LONG TERM GOALS: Target date: 05/14/22  Pt to meet Foto goal of 58% Baseline: 44% Goal status: INITIAL  2.  Pt will improve on TUG test  to <or=20s to demonstrate improve LE function, mobility and decreased fall risk Baseline: Tug 33 with cane Goal status: INITIAL  3.  Pt will report toleration to activity to be up towards 2 hour before being limited by LB pain Baseline: 1 hour Goal status: INITIAL  4.  Pt worst LBP to be on or below 4/10 to demonstrate improved management of chronic pain Baseline: 7/10 Goal status: INITIAL  5.  Pt will improve ROM of Lumbar spine in all areas by 25% to demonstrate improving function. Baseline: see chart Goal status: INITIAL    PLAN:  PT FREQUENCY: 1-2x/week  PT DURATION: 10 weeks [facility]  PLANNED INTERVENTIONS: Therapeutic exercises, Therapeutic activity, Neuromuscular re-education, Balance training, Gait training, Patient/Family education, Self Care, Joint mobilization, Joint manipulation, Stair training, Orthotic/Fit training, DME instructions, Aquatic Therapy, Dry Needling, Electrical stimulation, Spinal manipulation, Spinal mobilization, Cryotherapy, Moist heat,  Splintting, Taping, Traction, Ultrasound, Ionotophoresis 4mg /ml Dexamethasone, Manual therapy, and Re-evaluation.  PLAN FOR NEXT SESSION: Aquatics for strengthening and ROM core and LE, balance retraining, stair climbing, proprioception retraining, toleration to activity  Kerin Perna, PTA 04/06/22 1:13 PM [facility] [address] [place], [place], [address] Phone: [phone]   Fax:  [phone]

## 2022-04-07 ENCOUNTER — Telehealth (HOSPITAL_BASED_OUTPATIENT_CLINIC_OR_DEPARTMENT_OTHER): Payer: Self-pay | Admitting: Physical Therapy

## 2022-04-07 DIAGNOSIS — N6022 Fibroadenosis of left breast: Secondary | ICD-10-CM | POA: Diagnosis not present

## 2022-04-07 NOTE — Progress Notes (Signed)
Attempted to obtain medical history via telephone, unable to reach at this time. Unable to leave voicemail to return pre surgical testing department's phone call,due to mailbox full.  

## 2022-04-07 NOTE — Telephone Encounter (Signed)
Patient called and left a message stating that her doctor is wanting her to stop aquatic therapy for seven days due to a procedure or open wound, that she cannot get wet. We will be canceling the next couple of appointments until she is healed and ready to get back in the water.

## 2022-04-08 ENCOUNTER — Ambulatory Visit (HOSPITAL_BASED_OUTPATIENT_CLINIC_OR_DEPARTMENT_OTHER): Payer: No Typology Code available for payment source | Admitting: Physical Therapy

## 2022-04-12 ENCOUNTER — Ambulatory Visit (HOSPITAL_BASED_OUTPATIENT_CLINIC_OR_DEPARTMENT_OTHER): Payer: No Typology Code available for payment source | Admitting: Physical Therapy

## 2022-04-13 ENCOUNTER — Ambulatory Visit (HOSPITAL_BASED_OUTPATIENT_CLINIC_OR_DEPARTMENT_OTHER): Payer: No Typology Code available for payment source | Admitting: Anesthesiology

## 2022-04-13 ENCOUNTER — Other Ambulatory Visit: Payer: Self-pay

## 2022-04-13 ENCOUNTER — Ambulatory Visit (HOSPITAL_BASED_OUTPATIENT_CLINIC_OR_DEPARTMENT_OTHER): Payer: No Typology Code available for payment source | Admitting: Physical Therapy

## 2022-04-13 ENCOUNTER — Encounter (HOSPITAL_COMMUNITY): Payer: Self-pay | Admitting: Surgery

## 2022-04-13 ENCOUNTER — Ambulatory Visit (HOSPITAL_COMMUNITY): Payer: No Typology Code available for payment source | Admitting: Anesthesiology

## 2022-04-13 ENCOUNTER — Encounter (HOSPITAL_COMMUNITY)
Admission: RE | Disposition: A | Discharge status: Home / Self Care | Payer: Self-pay | Source: Ambulatory Visit | Attending: Surgery

## 2022-04-13 ENCOUNTER — Ambulatory Visit (HOSPITAL_COMMUNITY)
Admission: RE | Admit: 2022-04-13 | Discharge: 2022-04-13 | Disposition: A | Discharge status: Home / Self Care | Payer: No Typology Code available for payment source | Source: Ambulatory Visit | Attending: Surgery | Admitting: Surgery

## 2022-04-13 DIAGNOSIS — M069 Rheumatoid arthritis, unspecified: Secondary | ICD-10-CM | POA: Diagnosis not present

## 2022-04-13 DIAGNOSIS — I10 Essential (primary) hypertension: Secondary | ICD-10-CM

## 2022-04-13 DIAGNOSIS — Z01818 Encounter for other preprocedural examination: Secondary | ICD-10-CM | POA: Insufficient documentation

## 2022-04-13 DIAGNOSIS — Z6841 Body Mass Index (BMI) 40.0 and over, adult: Secondary | ICD-10-CM | POA: Insufficient documentation

## 2022-04-13 HISTORY — PX: BIOPSY: SHX5522

## 2022-04-13 HISTORY — PX: ESOPHAGOGASTRODUODENOSCOPY: SHX5428

## 2022-04-13 SURGERY — EGD (ESOPHAGOGASTRODUODENOSCOPY)
Anesthesia: Monitor Anesthesia Care

## 2022-04-13 MED ORDER — LACTATED RINGERS IV SOLN
INTRAVENOUS | Status: DC
  Administered 2022-04-13: 1000 mL via INTRAVENOUS

## 2022-04-13 MED ORDER — PROPOFOL 10 MG/ML IV BOLUS
INTRAVENOUS | Status: DC | PRN
  Administered 2022-04-13 (×2): 30 mg via INTRAVENOUS

## 2022-04-13 MED ORDER — LIDOCAINE 2% (20 MG/ML) 5 ML SYRINGE
INTRAMUSCULAR | Status: DC | PRN
  Administered 2022-04-13: 80 mg via INTRAVENOUS

## 2022-04-13 MED ORDER — PROPOFOL 500 MG/50ML IV EMUL
INTRAVENOUS | Status: DC | PRN
  Administered 2022-04-13: 125 ug/kg/min via INTRAVENOUS

## 2022-04-13 NOTE — Transfer of Care (Signed)
Immediate Anesthesia Transfer of Care Note  Patient: Melinda Hayes  Procedure(s) Performed: ESOPHAGOGASTRODUODENOSCOPY (EGD) BIOPSY  Patient Location: Endoscopy Unit  Anesthesia Type:MAC  Level of Consciousness: oriented, drowsy, and patient cooperative  Airway & Oxygen Therapy: Patient Spontanous Breathing and Patient connected to face mask oxygen  Post-op Assessment: Report given to RN and Post -op Vital signs reviewed and stable  Post vital signs: Reviewed  Last Vitals:  Vitals Value Taken Time  BP    Temp    Pulse    Resp    SpO2      Last Pain:  Vitals:   04/13/22 1100  TempSrc: Temporal  PainSc: 0-No pain         Complications: No notable events documented.

## 2022-04-13 NOTE — Anesthesia Postprocedure Evaluation (Signed)
Anesthesia Post Note  Patient: Melinda Hayes  Procedure(s) Performed: ESOPHAGOGASTRODUODENOSCOPY (EGD) BIOPSY     Patient location during evaluation: PACU Anesthesia Type: MAC Level of consciousness: awake and alert Pain management: pain level controlled Vital Signs Assessment: post-procedure vital signs reviewed and stable Respiratory status: spontaneous breathing, nonlabored ventilation, respiratory function stable and patient connected to nasal cannula oxygen Cardiovascular status: stable and blood pressure returned to baseline Postop Assessment: no apparent nausea or vomiting Anesthetic complications: no  No notable events documented.  Last Vitals:  Vitals:   04/13/22 1200 04/13/22 1210  BP: (!) 165/93 (!) 166/94  Pulse: 72 73  Resp: 18 19  Temp:    SpO2: 97% 96%    Last Pain:  Vitals:   04/13/22 1210  TempSrc:   PainSc: 0-No pain                 Arihana Ambrocio S

## 2022-04-13 NOTE — H&P (Signed)
   Admitting Physician: Glenn  Service: Bariatric surgery  CC: preoperative EGD  Subjective   HPI: Elizah Lydon is an 67 y.o. female who is here for preoperative EGD prior to bariatric surgery.  Past Medical History:  Diagnosis Date   Hypertension    Rheumatoid arthritis ([place]) 2019    Past Surgical History:  Procedure Laterality Date   ABDOMINAL HYSTERECTOMY     BACK SURGERY     TOTAL KNEE ARTHROPLASTY Right 2016    History reviewed. No pertinent family history.  Social:  reports that she has never smoked. She has never used smokeless tobacco. She reports that she does not currently use alcohol. She reports that she does not currently use drugs.  Allergies:  Allergies  Allergen Reactions   Statins     cramps    Medications: Current Outpatient Medications  Medication Instructions   acetaminophen (TYLENOL) 1,000 mg, Oral, Every 6 hours PRN   amLODipine (NORVASC) 10 mg, Oral, Daily   ezetimibe (ZETIA) 5 mg, Oral, Daily   ferrous sulfate 325 mg, Oral, Every M-W-F   gabapentin (NEURONTIN) 300 mg, Oral, Daily at bedtime   methocarbamol (ROBAXIN) 750 mg, Oral, 4 times daily   Multiple Vitamin (MULTIVITAMIN WITH MINERALS) TABS tablet 1 tablet, Oral, Daily   triamterene-hydrochlorothiazide (MAXZIDE-25) 37.5-25 MG tablet 0.5 tablets, Oral, Daily   Vitamin D (Ergocalciferol) (DRISDOL) 50,000 Units, Oral, Every Mon    ROS - all of the below systems have been reviewed with the patient and positives are indicated with bold text General: chills, fever or night sweats Eyes: blurry vision or double vision ENT: epistaxis or sore throat Allergy/Immunology: itchy/watery eyes or nasal congestion Hematologic/Lymphatic: bleeding problems, blood clots or swollen lymph nodes Endocrine: temperature intolerance or unexpected weight changes Breast: new or changing breast lumps or nipple discharge Resp: cough, shortness of breath, or wheezing CV: chest pain or dyspnea on  exertion GI: as per HPI GU: dysuria, trouble voiding, or hematuria MSK: joint pain or joint stiffness Neuro: TIA or stroke symptoms Derm: pruritus and skin lesion changes Psych: anxiety and depression  Objective   PE There were no vitals taken for this visit. Constitutional: NAD; conversant; no deformities Eyes: Moist conjunctiva; no lid lag; anicteric; PERRL Neck: Trachea midline; no thyromegaly Lungs: Normal respiratory effort; no tactile fremitus CV: RRR; no palpable thrills; no pitting edema GI: Abd Soft, nontender; no palpable hepatosplenomegaly MSK: Normal range of motion of extremities; no clubbing/cyanosis Psychiatric: Appropriate affect; alert and oriented x3 Lymphatic: No palpable cervical or axillary lymphadenopathy  No results found for this or any previous visit (from the past 24 hour(s)).  Imaging Orders  No imaging studies ordered today     Assessment and Plan   Marcele Kosta is an 67 y.o. female with morbid obesity (BMI 62), who is here for preoperative EGD prior to bariatric surgery.  She is interested in a sleeve gastrectomy.  I explained my rational for performing upper endoscopy in my bariatric patients. During the procedure I will biopsy for H. Pylori, evaluate for hiatal hernia, evaluate for reflux esophagitis and look for any other abnormalities that may influence the procedure. We discussed the risks, benefits and alternative to this procedure and the patient granted consent to proceed.    Felicie Morn, MD  [facility] Use [email or web address] to contact on call provider

## 2022-04-13 NOTE — Op Note (Signed)
[facility] Patient Name: Melinda Hayes Procedure Date: 04/13/2022 MRN: [id number] Attending MD: Felicie Morn , ,  Date of Birth: 07-29-1955 CSN: [id number] Age: 67 Admit Type: Outpatient Procedure:                Upper GI endoscopy Indications:               Providers:                Felicie Morn, Jaci Carrel, RN, Darliss Cheney, Technician, Luciana Axe, CRNA Referring MD:              Medicines:                Monitored Anesthesia Care Complications:            No immediate complications. Estimated Blood Loss:     Estimated blood loss was minimal. Procedure:                Pre-Anesthesia Assessment:                           - Prior to the procedure, a History and Physical                            was performed, and patient medications and                            allergies were reviewed. The patient is competent.                            The risks and benefits of the procedure and the                            sedation options and risks were discussed with the                            patient. All questions were answered and informed                            consent was obtained. Patient identification and                            proposed procedure were verified. Mental Status                            Examination: alert and oriented. Airway                            Examination: normal oropharyngeal airway and neck                            mobility. Respiratory Examination: clear to                            auscultation. CV Examination:  normal. ASA Grade                            Assessment: I - A normal, healthy patient. After                            reviewing the risks and benefits, the patient was                            deemed in satisfactory condition to undergo the                            procedure. The anesthesia plan was to use no                            sedation or anesthesia.  Immediately prior to                            administration of medications, the patient was                            re-assessed for adequacy to receive sedatives. The                            heart rate, respiratory rate, oxygen saturations,                            blood pressure, adequacy of pulmonary ventilation,                            and response to care were monitored throughout the                            procedure. The physical status of the patient was                            re-assessed after the procedure.                           After obtaining informed consent, the endoscope was                            passed under direct vision. Throughout the                            procedure, the patient's blood pressure, pulse, and                            oxygen saturations were monitored continuously. The                            [id number] Olympus endoscope was introduced  through the mouth, and advanced to the second part                            of duodenum. The upper GI endoscopy was                            accomplished without difficulty. The patient                            tolerated the procedure well. Scope In: Scope Out: Findings:      The examined esophagus was normal.      The entire examined stomach was normal. Biopsies were taken with a cold       forceps for histology. Biopsies were taken with a cold forceps for       Helicobacter pylori testing. Verification of patient identification for       the specimen was done. Estimated blood loss was minimal.      The examined duodenum was normal. Impression:               - Normal esophagus.                           - Normal stomach. Biopsied.                           - Normal examined duodenum. Moderate Sedation:      Moderate (conscious) sedation was personally administered by an       anesthesia professional. The following parameters were monitored:  oxygen       saturation, heart rate, blood pressure, and response to care. Total       physician intraservice time was 15 minutes. Recommendation:           - Discharge patient to home.                           - Resume previous diet.                           - Continue present medications.                           - Await pathology results. Procedure Code(s):        --- Professional ---                           [id number], Esophagogastroduodenoscopy, flexible,                            transoral; with biopsy, single or multiple Diagnosis Code(s):        --- Professional ---                           [id number], Encounter for other preprocedural                            examination CPT copyright 2022 American Medical Association. All rights reserved. The codes documented in this report are preliminary and upon  coder review may  be revised to meet current compliance requirements. [place],  04/13/2022 11:50:55 AM This report has been signed electronically. Number of Addenda: 0

## 2022-04-13 NOTE — Anesthesia Procedure Notes (Signed)
Procedure Name: MAC Date/Time: 04/13/2022 11:32 AM  Performed by: Jenne Campus, CRNAPre-anesthesia Checklist: Patient identified, Emergency Drugs available, Suction available and Patient being monitored Oxygen Delivery Method: Simple face mask Placement Confirmation: positive ETCO2

## 2022-04-13 NOTE — Anesthesia Preprocedure Evaluation (Addendum)
Anesthesia Evaluation  Patient identified by MRN, date of birth, ID band Patient awake    Reviewed: Allergy & Precautions, H&P , NPO status , Patient's Chart, lab work & pertinent test results  Airway Mallampati: II  TM Distance: >3 FB Neck ROM: Full    Dental no notable dental hx. (+) Dental Advisory Given, Partial Upper   Pulmonary neg pulmonary ROS   Pulmonary exam normal breath sounds clear to auscultation       Cardiovascular hypertension, Pt. on medications Normal cardiovascular exam Rhythm:Regular Rate:Normal     Neuro/Psych negative neurological ROS  negative psych ROS   GI/Hepatic negative GI ROS, Neg liver ROS,,,  Endo/Other  negative endocrine ROS    Renal/GU negative Renal ROS  negative genitourinary   Musculoskeletal  (+) Arthritis , Rheumatoid disorders,    Abdominal   Peds negative pediatric ROS (+)  Hematology negative hematology ROS (+)   Anesthesia Other Findings   Reproductive/Obstetrics negative OB ROS                              Anesthesia Physical Anesthesia Plan  ASA: 2  Anesthesia Plan: MAC   Post-op Pain Management: Minimal or no pain anticipated   Induction: Intravenous  PONV Risk Score and Plan: 2 and Propofol infusion and Treatment may vary due to age or medical condition  Airway Management Planned: Simple Face Mask  Additional Equipment:   Intra-op Plan:   Post-operative Plan:   Informed Consent: I have reviewed the patients History and Physical, chart, labs and discussed the procedure including the risks, benefits and alternatives for the proposed anesthesia with the patient or authorized representative who has indicated his/her understanding and acceptance.     Dental advisory given  Plan Discussed with: CRNA and Surgeon  Anesthesia Plan Comments:         Anesthesia Quick Evaluation

## 2022-04-14 ENCOUNTER — Encounter (HOSPITAL_COMMUNITY): Payer: Self-pay | Admitting: Surgery

## 2022-04-14 LAB — SURGICAL PATHOLOGY

## 2022-04-15 ENCOUNTER — Encounter (HOSPITAL_BASED_OUTPATIENT_CLINIC_OR_DEPARTMENT_OTHER): Payer: Self-pay | Admitting: Physical Therapy

## 2022-04-15 ENCOUNTER — Ambulatory Visit (HOSPITAL_BASED_OUTPATIENT_CLINIC_OR_DEPARTMENT_OTHER): Payer: No Typology Code available for payment source | Admitting: Physical Therapy

## 2022-04-15 DIAGNOSIS — R2689 Other abnormalities of gait and mobility: Secondary | ICD-10-CM

## 2022-04-15 DIAGNOSIS — M5459 Other low back pain: Secondary | ICD-10-CM | POA: Diagnosis not present

## 2022-04-15 DIAGNOSIS — M6281 Muscle weakness (generalized): Secondary | ICD-10-CM

## 2022-04-15 DIAGNOSIS — G8929 Other chronic pain: Secondary | ICD-10-CM

## 2022-04-15 NOTE — Therapy (Signed)
OUTPATIENT PHYSICAL THERAPY THORACOLUMBAR TREATMEANT   Patient Name: Melinda Hayes MRN: [id number] DOB:1955-11-07, 67 y.o., female Today's Date: 04/15/2022  END OF SESSION:  PT End of Session - 04/15/22 1036     Visit Number 7    Number of Visits 15    Date for PT Re-Evaluation 05/14/22    Authorization Type [facility]    Authorization Time Period Department of [place]: [id number]  [facility] Contact: Birdie Riddle @ [phone] visits approved from 12.05.2023 - 04.03.2024    Progress Note Due on Visit 10    PT Start Time 1032    PT Stop Time 1115    PT Time Calculation (min) 43 min    Activity Tolerance Patient limited by pain    Behavior During Therapy [facility] for tasks assessed/performed                Past Medical History:  Diagnosis Date   Hypertension    Rheumatoid arthritis ([place]) 2019   Past Surgical History:  Procedure Laterality Date   ABDOMINAL HYSTERECTOMY     BACK SURGERY     BIOPSY  04/13/2022   Procedure: BIOPSY;  Surgeon: Felicie Morn, MD;  Location: [facility];  Service: General;;   ESOPHAGOGASTRODUODENOSCOPY N/A 04/13/2022   Procedure: ESOPHAGOGASTRODUODENOSCOPY (EGD);  Surgeon: Felicie Morn, MD;  Location: [facility];  Service: General;  Laterality: N/A;   TOTAL KNEE ARTHROPLASTY Right 2016   Patient Active Problem List   Diagnosis Date Noted   Cervical radiculopathy 10/25/2017   Chronic midline low back pain without sciatica 10/25/2017    PCP: Myrla Halsted, MD  REFERRING PROVIDER: Myrla Halsted, MD   REFERRING DIAG: M54.50 (ICD-10-CM) - Low back pain, unspecified   Rationale for Evaluation and Treatment: M54.50 (ICD-10-CM) - Low back pain, unspecified   THERAPY DIAG:  Other low back pain  Chronic pain of left knee  Muscle weakness (generalized)  Other abnormalities of gait and mobility  ONSET DATE: >64yr  SUBJECTIVE:                                                                                                                                                                                            SUBJECTIVE STATEMENT:  Pt reports increased knee pain and cervical/shoulder "still there"  PERTINENT HISTORY:  Morbid obesity; HTN, L TKR  PAIN:  Are you having pain? Yes: NPRS scale: 2-3/10 Pain location: Lt arm to wrist  Aggravating factors: use of LUE Relieving factors: sitting;  meds  PRECAUTIONS: Back and Fall  WEIGHT BEARING RESTRICTIONS: No  FALLS:  Has patient fallen in last 6 months?  Pt  denies  LIVING ENVIRONMENT: Lives with: lives with their family Lives in: House/apartment Stairs: No Has following equipment at home: Single point cane  OCCUPATION: retired/disabled  PLOF: Independent with household mobility with device  PATIENT GOALS: lose weight; improve ability to walk, decrease pain  NEXT MD VISIT:   OBJECTIVE:   DIAGNOSTIC FINDINGS:  None in chart. 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 No tenderness noted.  LUMBAR ROM:  Pt limited in all Lumbar ROM by Pain: AROM eval  Flexion Finger tips below knees (~50%)  Extension 50%  Right lateral flexion 50%  Left lateral flexion 25%  Right rotation   Left rotation    (Blank rows = not tested)  LOWER EXTREMITY ROM:     Active  Right eval Left eval  Hip flexion    Hip extension    Hip abduction    Hip adduction    Hip internal rotation    Hip external rotation    Knee flexion  90  Knee extension  0  Ankle dorsiflexion    Ankle plantarflexion    Ankle inversion    Ankle eversion     (Blank  rows = not tested)  LOWER EXTREMITY MMT:    MMT Right eval Left eval Right / Left 04-06-22  Hip flexion 3+ supine 3 4- / 4  Hip extension     Hip abduction 5 5   Hip adduction 5 5   Hip internal rotation     Hip external rotation     Knee flexion 5 3+ P! 4-/5 P!  Knee extension 5 4-P! 4+ P!  Ankle dorsiflexion 5 5   Ankle plantarflexion     Ankle inversion     Ankle eversion      (Blank rows = not tested)  LUMBAR SPECIAL TESTS:  Straight leg raise test: Positive and Slump test: Negative  FUNCTIONAL TESTS:  5x Sts 31.05 armed chair Tug 33 with cane  04/06/22 5x STS 26s  GAIT: Distance walked: 50 Assistive device utilized: Single point cane Level of assistance: Complete Independence Comments: antalgic limb lle, cane left side, decreased stance time lle  TODAY'S TREATMENT:          Manual: deep tissue massage left cervical spine and upper/middle trap                                                                                                                        Pt seen for aquatic therapy today.  Treatment took place in water 3.25-3.75 ft in depth at the [facility]. Temp of  water was 92.  Pt entered/exited the pool via stairs using step-to pattern with bilat hand rail.   *Walking forward/backward  UE support white barbell  * UE on white barbell: side stepping R/L 2 laps   *Side lunge x 4 widths  *cervical ROM and stretching all planes  *Shoulder stretching: scapular depression and retraction; shoulder shrugging * Lt shoulder pendulum in water; Shoulder add/abd *forward amb without UE support w/ue arm swing *seated on bench: LAQ x12. STS onto blue step x 10 w/o ue use;  cycling, alternating flutter and add/abd   Pt requires the buoyancy and hydrostatic pressure of water for support, and to offload joints by unweighting joint load by at least 50 % in navel deep water and by at least 75-80% in chest to neck deep water.  Viscosity of the water is  needed for resistance of strengthening. Water current perturbations provides challenge to standing balance requiring increased core activation.     PATIENT EDUCATION:  Education details: Aquatics progressions and modifications Person educated: Patient Education method: Explanation Education comprehension: verbalized understanding  HOME EXERCISE PROGRAM: Access Code: [email or web address] URL: [email or web address] Date: 03/05/2022 Prepared by: Denton Meek  Exercises - Supine Quad Set  - 1 x daily - 7 x weekly - 3 sets - 10 reps - Supine Posterior Pelvic Tilt  - 1 x daily - 7 x weekly - 3 sets - 10 reps  Added: Access Code: [email or web address] URL: [email or web address] Date: 04/15/2022 Prepared by: Denton Meek  Exercises - Seated Scapular Retraction  - 1 x daily - 7 x weekly - 3 sets - 10 reps - Standing Scapular Depression  - 1 x daily - 7 x weekly - 3 sets - 10 reps - Seated Cervical Sidebending Stretch  - 1 x daily - 7 x weekly - 3 sets - 10 reps - Seated Cervical Rotation AROM  - 1 x daily - 7 x weekly - 3 sets - 10 reps  ASSESSMENT:  CLINICAL IMPRESSION: Continued c/o cervical and left shoulder discomfort.  Pt edu on relaxing posture to avoid guarding in cervical area due to pain. Cervical and shoulder exercises added to HEP. Pt with improved balance with amb able to walk in pool without ue support using arm swing. Began scheduling pt to land based therapy as appointments available.  Goals ongoing     OBJECTIVE IMPAIRMENTS: Abnormal gait, decreased activity tolerance, decreased balance, decreased endurance, decreased mobility, difficulty walking, decreased ROM, decreased strength, improper body mechanics, obesity, and pain.   ACTIVITY LIMITATIONS: carrying, lifting, bending, standing, squatting, stairs, transfers, and locomotion level  PARTICIPATION LIMITATIONS: meal prep, cleaning, laundry, shopping, community activity, and yard work  PERSONAL FACTORS:  Age, Behavior pattern, Fitness, and 1-2 comorbidities: morbid obesity, HTN, RA  are also affecting patient's functional outcome.   REHAB POTENTIAL: Good  CLINICAL DECISION MAKING: Evolving/moderate complexity  EVALUATION COMPLEXITY: Moderate   GOALS: Goals reviewed with patient? Yes  SHORT TERM GOALS: Target date: 04/09/22  Pt to tolerate full sessions of aquatic therapy without any increase in pain Baseline:TBA Goal status: Met 04/06/22  2.  Pt will improve on 5 X STS test to < 25s to demonstrate improving strength, transitional  movements and balance Baseline: 5x Sts 31.05 armed chair Goal status: Ongoing (26s) 04/06/22  3.  Pt will use cane properly ( right hand) to decrease l knee pain and decrease LBP and improve safety/gait Baseline: Using cane in left hand (dominant) Goal status: Met 04/06/22  4.  Pt will improve strength in all  areas where there is a deficit by at least 1/2 grade. Baseline: see chart Goal status: Met 04/06/22   LONG TERM GOALS: Target date: 05/14/22  Pt to meet Foto goal of 58% Baseline: 44% Goal status: INITIAL  2.  Pt will improve on TUG test to <or=20s to demonstrate improve LE function, mobility and decreased fall risk Baseline: Tug 33 with cane Goal status: INITIAL  3.  Pt will report toleration to activity to be up towards 2 hour before being limited by LB pain Baseline: 1 hour Goal status: INITIAL  4.  Pt worst LBP to be on or below 4/10 to demonstrate improved management of chronic pain Baseline: 7/10 Goal status: INITIAL  5.  Pt will improve ROM of Lumbar spine in all areas by 25% to demonstrate improving function. Baseline: see chart Goal status: INITIAL    PLAN:  PT FREQUENCY: 1-2x/week  PT DURATION: 10 weeks 15 visits as per [facility]  PLANNED INTERVENTIONS: Therapeutic exercises, Therapeutic activity, Neuromuscular re-education, Balance training, Gait training, Patient/Family education, Self Care, Joint mobilization, Joint manipulation,  Stair training, Orthotic/Fit training, DME instructions, Aquatic Therapy, Dry Needling, Electrical stimulation, Spinal manipulation, Spinal mobilization, Cryotherapy, Moist heat, Splintting, Taping, Traction, Ultrasound, Ionotophoresis 76m/ml Dexamethasone, Manual therapy, and Re-evaluation.  PLAN FOR NEXT SESSION: Aquatics for strengthening and ROM core and LE, balance retraining, stair climbing, proprioception retraining, toleration to activity  MStanton Kidney(Tharon Aquas Shastina Rua MPT 04/15/22 1:40 PM CPle[facility]Rehab Services [address] StreetGLake Jun[place] [address]Phone: [phone]  Fax:  [phone]

## 2022-04-20 ENCOUNTER — Encounter (HOSPITAL_BASED_OUTPATIENT_CLINIC_OR_DEPARTMENT_OTHER): Payer: Self-pay | Admitting: Physical Therapy

## 2022-04-20 ENCOUNTER — Ambulatory Visit (HOSPITAL_BASED_OUTPATIENT_CLINIC_OR_DEPARTMENT_OTHER): Payer: No Typology Code available for payment source | Admitting: Physical Therapy

## 2022-04-20 DIAGNOSIS — M5459 Other low back pain: Secondary | ICD-10-CM

## 2022-04-20 DIAGNOSIS — G8929 Other chronic pain: Secondary | ICD-10-CM

## 2022-04-20 DIAGNOSIS — M6281 Muscle weakness (generalized): Secondary | ICD-10-CM

## 2022-04-20 DIAGNOSIS — R2689 Other abnormalities of gait and mobility: Secondary | ICD-10-CM

## 2022-04-20 NOTE — Therapy (Signed)
OUTPATIENT PHYSICAL THERAPY THORACOLUMBAR TREATMEANT   Patient Name: Melinda Hayes MRN: [id number] DOB:[birth year], 67 y.o., female Today's Date: 04/20/2022  END OF SESSION:  PT End of Session - 04/20/22 1036     Visit Number 8    Number of Visits 15    Date for PT Re-Evaluation 05/14/22    Authorization Type [facility]    Authorization Time Period [place]: [id number]  [facility] ContactBirdie Riddle @ [phone] visits approved from 12.05.2023 - 04.03.2024    Progress Note Due on Visit 10    PT Start Time 1032    PT Stop Time 1114    PT Time Calculation (min) 42 min    Behavior During Therapy [facility] for tasks assessed/performed                Past Medical History:  Diagnosis Date   Hypertension    Rheumatoid arthritis ([place]) 2019   Past Surgical History:  Procedure Laterality Date   ABDOMINAL HYSTERECTOMY     BACK SURGERY     BIOPSY  04/13/2022   Procedure: BIOPSY;  Surgeon: Felicie Morn, MD;  Location: [facility];  Service: General;;   ESOPHAGOGASTRODUODENOSCOPY N/A 04/13/2022   Procedure: ESOPHAGOGASTRODUODENOSCOPY (EGD);  Surgeon: Felicie Morn, MD;  Location: [facility];  Service: General;  Laterality: N/A;   TOTAL KNEE ARTHROPLASTY Right 2016   Patient Active Problem List   Diagnosis Date Noted   Cervical radiculopathy 10/25/2017   Chronic midline low back pain without sciatica 10/25/2017    PCP: Myrla Halsted, MD  REFERRING PROVIDER: Myrla Halsted, MD   REFERRING DIAG: M54.50 (ICD-10-CM) - Low back pain, unspecified   Rationale for Evaluation and Treatment: M54.50 (ICD-10-CM) - Low back pain, unspecified   THERAPY DIAG:  Other low back pain  Chronic pain of left knee  Muscle weakness (generalized)  Other abnormalities of gait and mobility  ONSET DATE: >31yr  SUBJECTIVE:                                                                                                                                                                                            SUBJECTIVE STATEMENT:  Pt reports she is making progress.  She states she did the HEP given to her last session, "it's going ok".    PERTINENT HISTORY:  Morbid obesity; HTN, L TKR  PAIN:  Are you having pain? no: NPRS scale: 0/10 Pain location:  Aggravating factors:  Relieving factors: sitting;  meds  PRECAUTIONS: Back and Fall  WEIGHT BEARING RESTRICTIONS: No  FALLS:  Has patient fallen in last 6 months?  Pt denies  LIVING ENVIRONMENT: Lives with: lives with their family Lives in: House/apartment Stairs: No Has following equipment at home: Single point cane  OCCUPATION: retired/disabled  PLOF: Independent with household mobility with device  PATIENT GOALS: lose weight; improve ability to walk, decrease pain  NEXT MD VISIT:   OBJECTIVE:   DIAGNOSTIC FINDINGS:  None in chart. 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 No tenderness noted.  LUMBAR ROM:  Pt limited in all Lumbar ROM by Pain: AROM eval  Flexion Finger tips below knees (~50%)  Extension 50%  Right lateral flexion 50%  Left lateral flexion 25%  Right rotation   Left rotation    (Blank rows = not tested)  LOWER EXTREMITY ROM:     Active  Right eval Left eval  Hip flexion    Hip extension    Hip abduction    Hip adduction    Hip internal rotation    Hip external rotation    Knee flexion  90  Knee extension  0  Ankle dorsiflexion    Ankle plantarflexion    Ankle inversion    Ankle eversion     (Blank rows = not tested)  LOWER  EXTREMITY MMT:    MMT Right eval Left eval Right / Left 04-06-22  Hip flexion 3+ supine 3 4- / 4  Hip extension     Hip abduction 5 5   Hip adduction 5 5   Hip internal rotation     Hip external rotation     Knee flexion 5 3+ P! 4-/5 P!  Knee extension 5 4-P! 4+ P!  Ankle dorsiflexion 5 5   Ankle plantarflexion     Ankle inversion     Ankle eversion      (Blank rows = not tested)  LUMBAR SPECIAL TESTS:  Straight leg raise test: Positive and Slump test: Negative  FUNCTIONAL TESTS:  5x Sts 31.05 armed chair Tug 33 with cane  04/06/22 5x STS 26s  GAIT: Distance walked: 50 Assistive device utilized: Single point cane Level of assistance: Complete Independence Comments: antalgic limb lle, cane left side, decreased stance time lle  TODAY'S TREATMENT:                                                                                                                     Pt seen for aquatic therapy today.  Treatment took place in water 3.25-3.75 ft in depth at the [facility]. Temp of water was 92.  Pt entered/exited the pool via stairs using step-to pattern with bilat hand rail.   *Walking forward/backward without support, with  reciprocal arm swing  * side step into squat (3-5s pause to get balance) L/R across width of pool without support  * forward marching with reciprocal arm swing  * seated on bench: LAQ   cycling, add/abd; STS x 10 w/o UE use  *bilat shoulder addct/abdct x 20; bilat shoulder ext/ flex x 10;  * back against wall: bilat shoulder horiz abdct/ addct x 5 (increased pain in Lt shoulder ) * Rt lateral neck flexion stretch x 3 * short blue noodle pull down to thighs x 15  * Lt shoulder pendulum in water; Shoulder add/abd * return to walking forward/backward with UE arm swing * side stepping with arms relaxed at sides Pt requires the buoyancy and hydrostatic pressure of water for support, and to offload joints by unweighting joint load by at least 50 % in  navel deep water and by at least 75-80% in chest to neck deep water.  Viscosity of the water is needed for resistance of strengthening. Water current perturbations provides challenge to standing balance requiring increased core activation.     PATIENT EDUCATION:  Education details: Aquatics progressions and modifications Person educated: Patient Education method: Explanation Education comprehension: verbalized understanding  HOME EXERCISE PROGRAM: Access Code: [email or web address] URL: [email or web address] Date: 03/05/2022 Prepared by: Denton Meek Exercises - Supine Quad Set  - 1 x daily - 7 x weekly - 3 sets - 10 reps - Supine Posterior Pelvic Tilt  - 1 x daily - 7 x weekly - 3 sets - 10 reps  Added: Access Code: [email or web address] Date: 04/15/2022 Prepared by: Denton Meek - Seated Scapular Retraction  - 1 x daily - 7 x weekly - 3 sets - 10 reps - Standing Scapular Depression  - 1 x daily - 7 x weekly - 3 sets - 10 reps - Seated Cervical Sidebending Stretch  - 1 x daily - 7 x weekly - 3 sets - 10 reps - Seated Cervical Rotation AROM  - 1 x daily - 7 x weekly - 3 sets - 10 reps  ASSESSMENT:  CLINICAL IMPRESSION: Pt arrives pain free.  She is offered floatation for UE support, but pt declined offer as she wants to work on walking in pool without assistance.  She reported increase in back pain to 5/10 with steadying herself during gait trials forward/backward; decreased with seated exercise. She reported increase in Lt shoulder pain with horiz abdct/addct; limited reps and pain decreased with change in exercise.  Pt's confidence and exercise tolerance improving each visit. Will begin scheduling pt to land based therapy as appointments available.  Pt progressing gradually towards remaining goals.   OBJECTIVE IMPAIRMENTS: Abnormal gait, decreased activity tolerance, decreased balance, decreased endurance, decreased mobility, difficulty walking, decreased ROM, decreased strength, improper  body mechanics, obesity, and pain.   ACTIVITY LIMITATIONS: carrying, lifting, bending, standing, squatting, stairs, transfers, and locomotion level  PARTICIPATION LIMITATIONS: meal prep, cleaning, laundry, shopping, community activity, and yard work  PERSONAL FACTORS: Age, Behavior pattern, Fitness, and 1-2 comorbidities: morbid obesity, HTN, RA  are also affecting patient's functional outcome.   REHAB POTENTIAL: Good  CLINICAL DECISION MAKING: Evolving/moderate complexity  EVALUATION COMPLEXITY: Moderate   GOALS: Goals reviewed with patient? Yes  SHORT TERM GOALS: Target date: 04/09/22  Pt to tolerate full sessions of aquatic therapy without any increase in pain Baseline:TBA Goal status: Met 04/06/22  2.  Pt will improve on 5 X STS test to < 25s to demonstrate improving strength, transitional  movements and balance Baseline: 5x Sts 31.05 armed  chair Goal status: Ongoing (26s) 04/06/22  3.  Pt will use cane properly ( right hand) to decrease l knee pain and decrease LBP and improve safety/gait Baseline: Using cane in left hand (dominant) Goal status: Met 04/06/22  4.  Pt will improve strength in all areas where there is a deficit by at least 1/2 grade. Baseline: see chart Goal status: Met 04/06/22   LONG TERM GOALS: Target date: 05/14/22  Pt to meet Foto goal of 58% Baseline: 44% Goal status: INITIAL  2.  Pt will improve on TUG test to <or=20s to demonstrate improve LE function, mobility and decreased fall risk Baseline: Tug 33 with cane Goal status: INITIAL  3.  Pt will report toleration to activity to be up towards 2 hour before being limited by LB pain Baseline: 1 hour Goal status: INITIAL  4.  Pt worst LBP to be on or below 4/10 to demonstrate improved management of chronic pain Baseline: 7/10 Goal status: INITIAL  5.  Pt will improve ROM of Lumbar spine in all areas by 25% to demonstrate improving function. Baseline: see chart Goal status: INITIAL    PLAN:  PT  FREQUENCY: 1-2x/week  PT DURATION: 10 weeks 15 visits [facility] per [facility]  PLANNED INTERVENTIONS: Therapeutic exercises, Therapeutic activity, Neuromuscular re-education, Balance training, Gait training, Patient/Family education, Self Care, Joint mobilization, Joint manipulation, Stair training, Orthotic/Fit training, DME instructions, Aquatic Therapy, Dry Needling, Electrical stimulation, Spinal manipulation, Spinal mobilization, Cryotherapy, Moist heat, Splintting, Taping, Traction, Ultrasound, Ionotophoresis 55m/ml Dexamethasone, Manual therapy, and Re-evaluation.  PLAN FOR NEXT SESSION: Aquatics for strengthening and ROM core and LE, balance retraining, stair climbing, proprioception retraining, toleration to activity.  JKerin Perna PTA 04/20/22 11:14 AM [facility] [address] [address]Phone: [phone]  Fax:  [phone]

## 2022-04-22 ENCOUNTER — Ambulatory Visit (HOSPITAL_BASED_OUTPATIENT_CLINIC_OR_DEPARTMENT_OTHER): Payer: No Typology Code available for payment source | Admitting: Physical Therapy

## 2022-04-22 ENCOUNTER — Encounter (HOSPITAL_BASED_OUTPATIENT_CLINIC_OR_DEPARTMENT_OTHER): Payer: Self-pay | Admitting: Physical Therapy

## 2022-04-22 DIAGNOSIS — M6281 Muscle weakness (generalized): Secondary | ICD-10-CM

## 2022-04-22 DIAGNOSIS — M5459 Other low back pain: Secondary | ICD-10-CM

## 2022-04-22 DIAGNOSIS — R2689 Other abnormalities of gait and mobility: Secondary | ICD-10-CM

## 2022-04-22 DIAGNOSIS — G8929 Other chronic pain: Secondary | ICD-10-CM

## 2022-04-22 NOTE — Therapy (Signed)
OUTPATIENT PHYSICAL THERAPY THORACOLUMBAR TREATMEANT   Patient Name: Melinda Hayes MRN: [id number] DOB:[birth year], 67 y.o., female Today's Date: 04/22/2022  END OF SESSION:  PT End of Session - 04/22/22 1033     Visit Number 9    Number of Visits 15    Date for PT Re-Evaluation 05/14/22    Authorization Type [facility]    Authorization Time Period [place]: [id number]  [facility] ContactBirdie Riddle @ [phone]  15 visits approved from 12.05.2023 - 04.03.2024    Progress Note Due on Visit 10    PT Start Time 1032    PT Stop Time 1110    PT Time Calculation (min) 38 min    Behavior During Therapy [facility] for tasks assessed/performed                Past Medical History:  Diagnosis Date   Hypertension    Rheumatoid arthritis ([place]) 2019   Past Surgical History:  Procedure Laterality Date   ABDOMINAL HYSTERECTOMY     BACK SURGERY     BIOPSY  04/13/2022   Procedure: BIOPSY;  Surgeon: Felicie Morn, MD;  Location: [facility];  Service: General;;   ESOPHAGOGASTRODUODENOSCOPY N/A 04/13/2022   Procedure: ESOPHAGOGASTRODUODENOSCOPY (EGD);  Surgeon: Felicie Morn, MD;  Location: [facility];  Service: General;  Laterality: N/A;   TOTAL KNEE ARTHROPLASTY Right 2016   Patient Active Problem List   Diagnosis Date Noted   Cervical radiculopathy 10/25/2017   Chronic midline low back pain without sciatica 10/25/2017    PCP: Myrla Halsted, MD  REFERRING PROVIDER: Myrla Halsted, MD   REFERRING DIAG: M54.50 (ICD-10-CM) - Low back pain, unspecified   Rationale for Evaluation and Treatment: M54.50 (ICD-10-CM) - Low back pain, unspecified   THERAPY DIAG:  Other low back pain  Chronic pain of left knee  Muscle weakness (generalized)  Other abnormalities of gait and mobility  ONSET DATE: >47yr  SUBJECTIVE:                                                                                                                                                                                            SUBJECTIVE STATEMENT:  Pt reports she hit her Lt knee on table just before leaving for therapy appt.      PERTINENT HISTORY:  Morbid obesity; HTN, L TKR  PAIN:  Are you having pain? yes: NPRS scale: 6/10 Lt knee , 5/10 Lt arm Pain location: see above Aggravating factors:  Relieving factors: sitting;  meds  PRECAUTIONS: Back and Fall  WEIGHT BEARING RESTRICTIONS: No  FALLS:  Has patient fallen in last 6 months?  Pt denies  LIVING ENVIRONMENT: Lives with: lives with their family Lives in: House/apartment Stairs: No Has following equipment at home: Single point cane  OCCUPATION: retired/disabled  PLOF: Independent with household mobility with device  PATIENT GOALS: lose weight; improve ability to walk, decrease pain  NEXT MD VISIT:   OBJECTIVE:   DIAGNOSTIC FINDINGS:  None in chart. 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 No tenderness noted.  LUMBAR ROM:  Pt limited in all Lumbar ROM by Pain: AROM eval  Flexion Finger tips below knees (~50%)  Extension 50%  Right lateral flexion 50%  Left lateral flexion 25%  Right rotation   Left rotation    (Blank rows = not tested)  LOWER EXTREMITY ROM:     Active  Right eval Left eval  Hip flexion    Hip extension    Hip abduction    Hip adduction    Hip internal rotation    Hip external rotation    Knee flexion  90  Knee extension  0  Ankle dorsiflexion    Ankle plantarflexion    Ankle inversion    Ankle eversion     (Blank rows = not tested)  LOWER  EXTREMITY MMT:    MMT Right eval Left eval Right / Left 04-06-22  Hip flexion 3+ supine 3 4- / 4  Hip extension     Hip abduction 5 5   Hip adduction 5 5   Hip internal rotation     Hip external rotation     Knee flexion 5 3+ P! 4-/5 P!  Knee extension 5 4-P! 4+ P!  Ankle dorsiflexion 5 5   Ankle plantarflexion     Ankle inversion     Ankle eversion      (Blank rows = not tested)  LUMBAR SPECIAL TESTS:  Straight leg raise test: Positive and Slump test: Negative  FUNCTIONAL TESTS:  5x Sts 31.05 armed chair Tug 33 with cane  04/06/22 5x STS 26s   04/22/22:  TUG with cane-22.55s GAIT: Distance walked: 50 Assistive device utilized: Single point cane Level of assistance: Complete Independence Comments: antalgic limb lle, cane left side, decreased stance time lle  TODAY'S TREATMENT:              Therapeutic exercise: NuStep, L3-4 x 4 min, UE/LE STS with UE on chair x 10 Seated bilat row with green band x 10  Seated LAQ x 3-5sec pause each LE, 2# added to LLE Seated quad stretch with single foot tucked under chair, sliding hips forward, x 15 sec x 2 reps each LE TUG  Seated cervical rotation and lateral flexion stretches, chin tucks  Side stepping L/R with UE on rail (61f each way,  x2) Standing bilat shoulder ext to neutral with core engaged x 11 Tandem gait forward/backward with RUE on counter  Shoulder flexion slide on cabinet x 2 L stretch at counter  Previous:  Pt seen for aquatic therapy today.  Treatment took place in water 3.25-3.75 ft in depth at the [facility]. Temp of water was 92.  Pt entered/exited the pool via stairs using step-to pattern with bilat hand rail.   *Walking forward/backward without support, with reciprocal arm swing  * side step into squat (3-5s pause to get balance) L/R across width of pool without support  * forward marching with  reciprocal arm swing  * seated on bench: LAQ   cycling, add/abd; STS x 10 w/o UE use  *bilat shoulder addct/abdct x 20; bilat shoulder ext/ flex x 10;  * back against wall: bilat shoulder horiz abdct/ addct x 5 (increased pain in Lt shoulder ) * Rt lateral neck flexion stretch x 3 * short blue noodle pull down to thighs x 15  * Lt shoulder pendulum in water; Shoulder add/abd * return to walking forward/backward with UE arm swing * side stepping with arms relaxed at sides Pt requires the buoyancy and hydrostatic pressure of water for support, and to offload joints by unweighting joint load by at least 50 % in navel deep water and by at least 75-80% in chest to neck deep water.  Viscosity of the water is needed for resistance of strengthening. Water current perturbations provides challenge to standing balance requiring increased core activation.     PATIENT EDUCATION:  Education details: land exercise progressions/modifications Person educated: Patient Education method: Explanation Education comprehension: verbalized understanding  HOME EXERCISE PROGRAM: Access Code: [email or web address] URL: [email or web address] Date: 03/05/2022 Prepared by: Denton Meek Exercises - Supine Quad Set  - 1 x daily - 7 x weekly - 3 sets - 10 reps - Supine Posterior Pelvic Tilt  - 1 x daily - 7 x weekly - 3 sets - 10 reps  Added: Access Code: [email or web address] Date: 04/15/2022 Prepared by: Denton Meek - Seated Scapular Retraction  - 1 x daily - 7 x weekly - 3 sets - 10 reps - Standing Scapular Depression  - 1 x daily - 7 x weekly - 3 sets - 10 reps - Seated Cervical Sidebending Stretch  - 1 x daily - 7 x weekly - 3 sets - 10 reps - Seated Cervical Rotation AROM  - 1 x daily - 7 x weekly - 3 sets - 10 reps  ASSESSMENT:  CLINICAL IMPRESSION: Pt reported increase in Lt knee pain at 3.5 min on NuStep and after completing LAQ and seated quad stretches.  Her TUG time has improved since eval. Varied  exercises (body part and positioning) as to not tax any part too much at one time.  She reported increase in Lt knee pain with exercises to 8/10; encouraged pt to utilize ice for pain relief when she arrives home. Pt participates well throughout and remains motivated to progress.   OBJECTIVE IMPAIRMENTS: Abnormal gait, decreased activity tolerance, decreased balance, decreased endurance, decreased mobility, difficulty walking, decreased ROM, decreased strength, improper body mechanics, obesity, and pain.   ACTIVITY LIMITATIONS: carrying, lifting, bending, standing, squatting, stairs, transfers, and locomotion level  PARTICIPATION LIMITATIONS: meal prep, cleaning, laundry, shopping, community activity, and yard work  PERSONAL FACTORS: Age, Behavior pattern, Fitness, and 1-2 comorbidities: morbid obesity, HTN, RA  are also affecting patient's functional outcome.   REHAB POTENTIAL: Good  CLINICAL DECISION MAKING: Evolving/moderate complexity  EVALUATION COMPLEXITY: Moderate   GOALS: Goals reviewed with patient? Yes  SHORT TERM GOALS: Target date: 04/09/22  Pt to tolerate full sessions of aquatic therapy without any increase in pain Baseline:TBA Goal  status: Met 04/06/22  2.  Pt will improve on 5 X STS test to < 25s to demonstrate improving strength, transitional  movements and balance Baseline: 5x Sts 31.05 armed chair at eval; (26s) 04/06/22 Goal status: Ongoing   3.  Pt will use cane properly ( right hand) to decrease l knee pain and decrease LBP and improve safety/gait Baseline: Using cane in left hand (dominant) Goal status: Met 04/06/22  4.  Pt will improve strength in all areas where there is a deficit by at least 1/2 grade. Baseline: see chart Goal status: Met 04/06/22   LONG TERM GOALS: Target date: 05/14/22  Pt to meet Foto goal of 58% Baseline: 44% Goal status: INITIAL  2.  Pt will improve on TUG test to <or=20s to demonstrate improve LE function, mobility and decreased fall  risk Baseline: Tug 33 with cane at eval; (22.55s on 2/22) Goal status:IN PROGRESS   3.  Pt will report toleration to activity to be up towards 2 hour before being limited by LB pain Baseline: 1 hour Goal status: INITIAL  4.  Pt worst LBP to be on or below 4/10 to demonstrate improved management of chronic pain Baseline: 7/10 Goal status: INITIAL  5.  Pt will improve ROM of Lumbar spine in all areas by 25% to demonstrate improving function. Baseline: see chart Goal status: INITIAL    PLAN:  PT FREQUENCY: 1-2x/week  PT DURATION: 10 weeks 15 [facility] per [facility]  PLANNED INTERVENTIONS: Therapeutic exercises, Therapeutic activity, Neuromuscular re-education, Balance training, Gait training, Patient/Family education, Self Care, Joint mobilization, Joint manipulation, Stair training, Orthotic/Fit training, DME instructions, Aquatic Therapy, Dry Needling, Electrical stimulation, Spinal manipulation, Spinal mobilization, Cryotherapy, Moist heat, Splintting, Taping, Traction, Ultrasound, Ionotophoresis 61m/ml Dexamethasone, Manual therapy, and Re-evaluation.  PLAN FOR NEXT SESSION: Aquatics for strengthening and ROM core and LE, balance retraining, stair climbing, proprioception retraining, toleration to activity.  JKerin Perna PTA 04/22/22 1:09 PM [facility] [address] [address]Phone: [phone]  Fax:  [phone]

## 2022-04-27 ENCOUNTER — Encounter (HOSPITAL_BASED_OUTPATIENT_CLINIC_OR_DEPARTMENT_OTHER): Payer: Self-pay | Admitting: Physical Therapy

## 2022-04-27 ENCOUNTER — Ambulatory Visit (HOSPITAL_BASED_OUTPATIENT_CLINIC_OR_DEPARTMENT_OTHER): Payer: No Typology Code available for payment source | Admitting: Physical Therapy

## 2022-04-27 DIAGNOSIS — G8929 Other chronic pain: Secondary | ICD-10-CM

## 2022-04-27 DIAGNOSIS — M6281 Muscle weakness (generalized): Secondary | ICD-10-CM

## 2022-04-27 DIAGNOSIS — M5459 Other low back pain: Secondary | ICD-10-CM | POA: Diagnosis not present

## 2022-04-27 NOTE — Therapy (Signed)
OUTPATIENT PHYSICAL THERAPY THORACOLUMBAR TREATMEANT   Patient Name: Melinda Hayes MRN: [id number] DOB:[birth year], 67 y.o., female Today's Date: 04/27/2022  END OF SESSION:  PT End of Session - 04/27/22 1034     Visit Number 10    Number of Visits 15    Date for PT Re-Evaluation 05/14/22    Authorization Type [facility]    Authorization Time Period Department of [place]: [id number]  [facility] Contact[employer]irdie Riddle @ [phone] visits approved from 12.05.2023 - 04.03.2024    PT Start Time 1031    PT Stop Time 1110    PT Time Calculation (min) 39 min    Behavior During Therapy [facility] for tasks assessed/performed                Past Medical History:  Diagnosis Date   Hypertension    Rheumatoid arthritis ([place]) 2019   Past Surgical History:  Procedure Laterality Date   ABDOMINAL HYSTERECTOMY     BACK SURGERY     BIOPSY  04/13/2022   Procedure: BIOPSY;  Surgeon: Felicie Morn, MD;  Location: [facility];  Service: General;;   ESOPHAGOGASTRODUODENOSCOPY N/A 04/13/2022   Procedure: ESOPHAGOGASTRODUODENOSCOPY (EGD);  Surgeon: Felicie Morn, MD;  Location: [facility];  Service: General;  Laterality: N/A;   TOTAL KNEE ARTHROPLASTY Right 2016   Patient Active Problem List   Diagnosis Date Noted   Cervical radiculopathy 10/25/2017   Chronic midline low back pain without sciatica 10/25/2017    PCP: Myrla Halsted, MD  REFERRING PROVIDER: Myrla Halsted, MD   REFERRING DIAG: M54.50 (ICD-10-CM) - Low back pain, unspecified   Rationale for Evaluation and Treatment: M54.50 (ICD-10-CM) - Low back pain, unspecified   THERAPY DIAG:  Other low back pain  Chronic pain of left knee  Muscle weakness (generalized)  ONSET DATE: >65yr  SUBJECTIVE:                                                                                                                                                                                           SUBJECTIVE  STATEMENT:  Pt reports she "could barely move" after last (land) session.  "My right shoulder, my left arm, my left knee, my back.  I iced all weekend.  I did Frankie's exercises even though I was hurting".   PERTINENT HISTORY:  Morbid obesity; HTN, L TKR  PAIN:  Are you having pain? yes: NPRS scale: 7/10 Pain location: see above Aggravating factors: certain exercises, stairs Relieving factors: sitting;  meds  PRECAUTIONS: Back and Fall  WEIGHT BEARING RESTRICTIONS: No  FALLS:  Has patient fallen in last 6 months?  Pt denies  LIVING ENVIRONMENT: Lives with: lives with their family Lives in: House/apartment Stairs: No Has following equipment at home: Single point cane  OCCUPATION: retired/disabled  PLOF: Independent with household mobility with device  PATIENT GOALS: lose weight; improve ability to walk, decrease pain  NEXT MD VISIT:   OBJECTIVE:   DIAGNOSTIC FINDINGS:  None in chart. 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 No tenderness noted.  LUMBAR ROM:  Pt limited in all Lumbar ROM by Pain: AROM eval  Flexion Finger tips below knees (~50%)  Extension 50%  Right lateral flexion 50%  Left lateral flexion 25%  Right rotation   Left rotation    (Blank rows = not tested)  LOWER EXTREMITY ROM:     Active  Right eval Left eval  Hip flexion    Hip extension    Hip abduction    Hip adduction    Hip internal rotation    Hip external rotation    Knee flexion  90  Knee extension  0  Ankle dorsiflexion    Ankle plantarflexion    Ankle inversion    Ankle  eversion     (Blank rows = not tested)  LOWER EXTREMITY MMT:    MMT Right eval Left eval Right / Left 04-06-22  Hip flexion 3+ supine 3 4- / 4  Hip extension     Hip abduction 5 5   Hip adduction 5 5   Hip internal rotation     Hip external rotation     Knee flexion 5 3+ P! 4-/5 P!  Knee extension 5 4-P! 4+ P!  Ankle dorsiflexion 5 5   Ankle plantarflexion     Ankle inversion     Ankle eversion      (Blank rows = not tested)  LUMBAR SPECIAL TESTS:  Straight leg raise test: Positive and Slump test: Negative  FUNCTIONAL TESTS:  5x Sts 31.05 armed chair Tug 33 with cane  04/06/22 5x STS 26s   04/22/22:  TUG with cane-22.55s GAIT: Distance walked: 50 Assistive device utilized: Single point cane Level of assistance: Complete Independence Comments: antalgic limb lle, cane left side, decreased stance time lle  TODAY'S TREATMENT:                                                                                                              Pt seen for aquatic therapy today.  Treatment took place in water 3.25-3.75 ft in [place] at the [place]. Temp of water was 92.  Pt entered/exited the pool via stairs using step-to pattern with bilat hand rail.   *With arms resting  on white barbell:  walking forward/backward, side stepping, marching   * short noodle pull down x 5 (painful in LUE, stopped )  * fins donned on wrists:  bilat shoulder IR/ER, abdct/ addct, reciprocal arm swing flex/ext; bicep/tricep x 10 of each  * ankle floats donned on wrists:  bilat flex/ext (instead of noodle pull down), single arm circumduction; forward walking with reciprocal arm swing  * seated on bench: alternating LAQ, cycling, add/abd; bilat LAQ; cycling; STS x 10 w/o UE use  * TUG like exercise x 3  * L stretch at wall and rails of stairs  Pt requires the buoyancy and hydrostatic pressure of water for support, and to offload joints by unweighting joint load by at least 50 % in navel deep  water and by at least 75-80% in chest to neck deep water.  Viscosity of the water is needed for resistance of strengthening. Water current perturbations provides challenge to standing balance requiring increased core activation.  Previous land  Therapeutic exercise: NuStep, L3-4 x 4 min, UE/LE STS with UE on chair x 10 Seated bilat row with green band x 10  Seated LAQ x 3-5sec pause each LE, 2# added to LLE Seated quad stretch with single foot tucked under chair, sliding hips forward, x 15 sec x 2 reps each LE TUG  Seated cervical rotation and lateral flexion stretches, chin tucks  Side stepping L/R with UE on rail (28f each way,  x2) Standing bilat shoulder ext to neutral with core engaged x 11 Tandem gait forward/backward with RUE on counter  Shoulder flexion slide on cabinet x 2 L stretch at counter    PATIENT EDUCATION:  Education details: land exercise progressions/modifications Person educated: Patient Education method: Explanation Education comprehension: verbalized understanding  HOME EXERCISE PROGRAM: Access Code: [email or web address]: [email or web address] Date: 03/05/2022 Prepared by: FDenton MeekExercises - Supine Quad Set  - 1 x daily - 7 x weekly - 3 sets - 10 reps - Supine Posterior Pelvic Tilt  - 1 x daily - 7 x weekly - 3 sets - 10 reps  Added: Access Code: [email or web address]: 04/15/2022 Prepared by: FDenton Meek- Seated Scapular Retraction  - 1 x daily - 7 x weekly - 3 sets - 10 reps - Standing Scapular Depression  - 1 x daily - 7 x weekly - 3 sets - 10 reps - Seated Cervical Sidebending Stretch  - 1 x daily - 7 x weekly - 3 sets - 10 reps - Seated Cervical Rotation AROM  - 1 x daily - 7 x weekly - 3 sets - 10 reps  ASSESSMENT:  CLINICAL IMPRESSION: Pt reported flare up of symptoms since land session.  Utilized floatation for UE support at beginning of session to encourage more relaxed gait.  Trialed varied alternatives for UE resistance with  good tolerance.  Pt reported mild improvement in pain in UE and back, but increase in pain in Lt knee while in the water.  Pt participates well throughout and remains motivated to progress. Progressing gradually towards remaining goals.  PT to assess goals next visit.   OBJECTIVE IMPAIRMENTS: Abnormal gait, decreased activity tolerance, decreased balance, decreased endurance, decreased mobility, difficulty walking, decreased ROM, decreased strength, improper body mechanics, obesity, and pain.   ACTIVITY LIMITATIONS: carrying, lifting, bending, standing, squatting, stairs, transfers, and locomotion level  PARTICIPATION LIMITATIONS: meal prep, cleaning, laundry, shopping, community activity, and yard work  PERSONAL FACTORS: Age, Behavior pattern, Fitness, and 1-2 comorbidities: morbid obesity, HTN, RA  are also  affecting patient's functional outcome.   REHAB POTENTIAL: Good  CLINICAL DECISION MAKING: Evolving/moderate complexity  EVALUATION COMPLEXITY: Moderate   GOALS: Goals reviewed with patient? Yes  SHORT TERM GOALS: Target date: 04/09/22  Pt to tolerate full sessions of aquatic therapy without any increase in pain Baseline:TBA Goal status: Met 04/06/22  2.  Pt will improve on 5 X STS test to < 25s to demonstrate improving strength, transitional  movements and balance Baseline: 5x Sts 31.05 armed chair at eval; (26s) 04/06/22 Goal status: Ongoing   3.  Pt will use cane properly ( right hand) to decrease l knee pain and decrease LBP and improve safety/gait Baseline: Using cane in left hand (dominant) Goal status: Met 04/06/22  4.  Pt will improve strength in all areas where there is a deficit by at least 1/2 grade. Baseline: see chart Goal status: Met 04/06/22   LONG TERM GOALS: Target date: 05/14/22  Pt to meet Foto goal of 58% Baseline: 44% Goal status: INITIAL  2.  Pt will improve on TUG test to <or=20s to demonstrate improve LE function, mobility and decreased fall  risk Baseline: Tug 33 with cane at eval; (22.55s on 2/22) Goal status:IN PROGRESS   3.  Pt will report toleration to activity to be up towards 2 hour before being limited by LB pain Baseline: 1 hour Goal status: INITIAL  4.  Pt worst LBP to be on or below 4/10 to demonstrate improved management of chronic pain Baseline: 7/10 Goal status: INITIAL  5.  Pt will improve ROM of Lumbar spine in all areas by 25% to demonstrate improving function. Baseline: see chart Goal status: INITIAL    PLAN:  PT FREQUENCY: 1-2x/week  PT DURATION: 10 weeks 15 visits as per [facility]  PLANNED INTERVENTIONS: Therapeutic exercises, Therapeutic activity, Neuromuscular re-education, Balance training, Gait training, Patient/Family education, Self Care, Joint mobilization, Joint manipulation, Stair training, Orthotic/Fit training, DME instructions, Aquatic Therapy, Dry Needling, Electrical stimulation, Spinal manipulation, Spinal mobilization, Cryotherapy, Moist heat, Splintting, Taping, Traction, Ultrasound, Ionotophoresis '4mg'$ /ml Dexamethasone, Manual therapy, and Re-evaluation.  PLAN FOR NEXT SESSION: Aquatics for strengthening and ROM core and LE, balance retraining, stair climbing, proprioception retraining, toleration to activity.  Kerin Perna, PTA 04/27/22 11:13 AM [facility] [address] [place], [place], [address] Phone: [phone]   Fax:  [phone]

## 2022-04-29 ENCOUNTER — Ambulatory Visit (HOSPITAL_BASED_OUTPATIENT_CLINIC_OR_DEPARTMENT_OTHER): Payer: No Typology Code available for payment source | Admitting: Physical Therapy

## 2022-04-29 ENCOUNTER — Encounter (HOSPITAL_BASED_OUTPATIENT_CLINIC_OR_DEPARTMENT_OTHER): Payer: Self-pay | Admitting: Physical Therapy

## 2022-04-29 DIAGNOSIS — M5459 Other low back pain: Secondary | ICD-10-CM

## 2022-04-29 DIAGNOSIS — R2689 Other abnormalities of gait and mobility: Secondary | ICD-10-CM

## 2022-04-29 DIAGNOSIS — M6281 Muscle weakness (generalized): Secondary | ICD-10-CM

## 2022-04-29 DIAGNOSIS — G8929 Other chronic pain: Secondary | ICD-10-CM

## 2022-04-29 NOTE — Therapy (Signed)
OUTPATIENT PHYSICAL THERAPY THORACOLUMBAR TREATMEANT   Patient Name: Melinda Hayes MRN: [id number] DOB:26-Oct-1955, 67 y.o., female Today's Date: 04/29/2022  END OF SESSION:  PT End of Session - 04/29/22 1325     Visit Number 11    Number of Visits 15    Date for PT Re-Evaluation 05/14/22    Authorization Type [facility]    Authorization Time Period Department of [place]: [id number]  [facility] Contact[employer]ie Riddle @ [phone] visits approved from 12.05.2023 - 04.03.2024    PT Start Time 1031    PT Stop Time 1115    PT Time Calculation (min) 44 min    Activity Tolerance Patient tolerated treatment well    Behavior During Therapy [facility] for tasks assessed/performed                Past Medical History:  Diagnosis Date   Hypertension    Rheumatoid arthritis ([place]) 2019   Past Surgical History:  Procedure Laterality Date   ABDOMINAL HYSTERECTOMY     BACK SURGERY     BIOPSY  04/13/2022   Procedure: BIOPSY;  Surgeon: Felicie Morn, MD;  Location: [facility];  Service: General;;   ESOPHAGOGASTRODUODENOSCOPY N/A 04/13/2022   Procedure: ESOPHAGOGASTRODUODENOSCOPY (EGD);  Surgeon: Felicie Morn, MD;  Location: [facility];  Service: General;  Laterality: N/A;   TOTAL KNEE ARTHROPLASTY Right 2016   Patient Active Problem List   Diagnosis Date Noted   Cervical radiculopathy 10/25/2017   Chronic midline low back pain without sciatica 10/25/2017    PCP: Myrla Halsted, MD  REFERRING PROVIDER: Myrla Halsted, MD   REFERRING DIAG: M54.50 (ICD-10-CM) - Low back pain, unspecified   Rationale for Evaluation and Treatment: M54.50 (ICD-10-CM) - Low back pain, unspecified   THERAPY DIAG:  Other low back pain  Chronic pain of left knee  Muscle weakness (generalized)  Other abnormalities of gait and mobility  ONSET DATE: >34yr  SUBJECTIVE:                                                                                                                                                                                            SUBJECTIVE STATEMENT:  "My back is doing pretty well it is my knee that hurts. ".   PERTINENT HISTORY:  Morbid obesity; HTN, L TKR  PAIN:  Are you having pain? yes: NPRS scale: 7/10 Knee; back 5/10 Pain location: see above Aggravating factors: certain exercises, stairs Relieving factors: sitting;  meds  PRECAUTIONS: Back and Fall  WEIGHT BEARING RESTRICTIONS: No  FALLS:  Has patient fallen in last 6 months?  Pt denies  LIVING ENVIRONMENT:  Lives with: lives with their family Lives in: House/apartment Stairs: No Has following equipment at home: Single point cane  OCCUPATION: retired/disabled  PLOF: Independent with household mobility with device  PATIENT GOALS: lose weight; improve ability to walk, decrease pain  NEXT MD VISIT:   OBJECTIVE:   DIAGNOSTIC FINDINGS:  None in chart. 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 No tenderness noted.  LUMBAR ROM:  Pt limited in all Lumbar ROM by Pain: AROM eval 04/29/22  Flexion Finger tips below knees (~50%) Mid calf WFL  Extension 50% 75%  Right lateral flexion 50% 75%  Left lateral flexion 25% 75%  Right rotation    Left rotation     (Blank rows = not tested)  LOWER EXTREMITY ROM:     Active  Right eval Left eval Left 04/29/22  Hip flexion     Hip extension     Hip abduction     Hip adduction     Hip internal rotation     Hip external rotation     Knee flexion  90 105  Knee extension  0 0  Ankle dorsiflexion     Ankle  plantarflexion     Ankle inversion     Ankle eversion      (Blank rows = not tested)  LOWER EXTREMITY MMT:    MMT Right eval Left eval Right / Left 04-06-22 Right / Left 04/29/22  Hip flexion 3+ supine 3 4- / 4 4+ /4+  Hip extension      Hip abduction 5 5    Hip adduction 5 5    Hip internal rotation      Hip external rotation      Knee flexion 5 3+ P! 4-/5 P! 4+P!  Knee extension 5 4-P! 4+ P! 5-P!  Ankle dorsiflexion 5 5    Ankle plantarflexion      Ankle inversion      Ankle eversion       (Blank rows = not tested)  LUMBAR SPECIAL TESTS:  Straight leg raise test: Positive and Slump test: Negative  FUNCTIONAL TESTS:  5x Sts 31.05 armed chair Tug 33 with cane  04/06/22 5x STS 26s   04/22/22:  TUG with cane-22.55s     GAIT: Distance walked: 50 Assistive device utilized: Single point cane Level of assistance: Complete Independence Comments: antalgic limb lle, cane left side, decreased stance time lle  TODAY'S TREATMENT:                    Objective measurements                                                                                            Pt seen for  aquatic therapy today.  Treatment took place in water 3.25-3.75 ft in depth at the [place]. Temp of water was 92.  Pt entered/exited the pool via stairs using step-to pattern with bilat hand rail.   *With arms resting on white barbell:  walking forward/backward, side stepping, marching  * L stretch at wall last rep with tail wagging *KB row le staggered then wide stance 2x12 rep ea positions *SLS ue support white barbell R/L. After several tries able to hold x 20s ea   *walking between tasks for recovery throughout session  * seated on bench:  STS x 10 w/o UE use; LAQ x20 R/L      Pt requires the buoyancy and hydrostatic pressure of water for support, and to offload joints by unweighting joint load by at least 50 % in navel deep water and by at least 75-80% in chest to neck deep water.   Viscosity of the water is needed for resistance of strengthening. Water current perturbations provides challenge to standing balance requiring increased core activation.  Previous land  Therapeutic exercise: NuStep, L3-4 x 4 min, UE/LE STS with UE on chair x 10 Seated bilat row with green band x 10  Seated LAQ x 3-5sec pause each LE, 2# added to LLE Seated quad stretch with single foot tucked under chair, sliding hips forward, x 15 sec x 2 reps each LE TUG  Seated cervical rotation and lateral flexion stretches, chin tucks  Side stepping L/R with UE on rail (25f each way,  x2) Standing bilat shoulder ext to neutral with core engaged x 11 Tandem gait forward/backward with RUE on counter  Shoulder flexion slide on cabinet x 2 L stretch at counter    PATIENT EDUCATION:  Education details: land exercise progressions/modifications Person educated: Patient Education method: Explanation Education comprehension: verbalized understanding  HOME EXERCISE PROGRAM: Access Code: [email or web address]: [email or web address] Date: 03/05/2022 Prepared by: FDenton MeekExercises - Supine Quad Set  - 1 x daily - 7 x weekly - 3 sets - 10 reps - Supine Posterior Pelvic Tilt  - 1 x daily - 7 x weekly - 3 sets - 10 reps  Added: Access Code: [email or web address]: 04/15/2022 Prepared by: FDenton Meek- Seated Scapular Retraction  - 1 x daily - 7 x weekly - 3 sets - 10 reps - Standing Scapular Depression  - 1 x daily - 7 x weekly - 3 sets - 10 reps - Seated Cervical Sidebending Stretch  - 1 x daily - 7 x weekly - 3 sets - 10 reps - Seated Cervical Rotation AROM  - 1 x daily - 7 x weekly - 3 sets - 10 reps  ASSESSMENT:  CLINICAL IMPRESSION: PN: Pt is progressing nicely with skilled physical therapy intervention.  She has made gains in Lumbar ROM (knee as well), strength and functional mobility as functional tests indicate above. Her left knee has become a > limitation vs her LB with progressing  OA.  She will call and make appointment with orthpedic MD as she had not realized it has been an entire year since she has visited him and she is preparing for a TKR once her weight loss was complete.  She has not yet met all of her goals so continue therapy is indicated.  We will revisit in 4 [place] past recert date    OBJECTIVE IMPAIRMENTS: Abnormal gait, decreased activity tolerance, decreased balance, decreased endurance, decreased mobility, difficulty walking, decreased ROM,  decreased strength, improper body mechanics, obesity, and pain.   ACTIVITY LIMITATIONS: carrying, lifting, bending, standing, squatting, stairs, transfers, and locomotion level  PARTICIPATION LIMITATIONS: meal prep, cleaning, laundry, shopping, community activity, and yard work  PERSONAL FACTORS: Age, Behavior pattern, Fitness, and 1-2 comorbidities: morbid obesity, HTN, RA  are also affecting patient's functional outcome.   REHAB POTENTIAL: Good  CLINICAL DECISION MAKING: Evolving/moderate complexity  EVALUATION COMPLEXITY: Moderate   GOALS: Goals reviewed with patient? Yes  SHORT TERM GOALS: Target date: 04/09/22  Pt to tolerate full sessions of aquatic therapy without any increase in pain Baseline:TBA Goal status: Met 04/06/22  2.  Pt will improve on 5 X STS test to < 25s to demonstrate improving strength, transitional  movements and balance Baseline: 5x Sts 31.05 armed chair at eval; (26s) 04/06/22 Goal status: Ongoing   3.  Pt will use cane properly ( right hand) to decrease l knee pain and decrease LBP and improve safety/gait Baseline: Using cane in left hand (dominant) Goal status: Met 04/06/22  4.  Pt will improve strength in all areas where there is a deficit by at least 1/2 grade. Baseline: see chart Goal status: Met 04/06/22   LONG TERM GOALS: Target date: 05/14/22  Pt to meet Foto goal of 58% Baseline: 44% Goal status: INITIAL  2.  Pt  will improve on TUG test to <or=20s to demonstrate improve LE function, mobility and decreased fall risk Baseline: Tug 33 with cane at eval; (22.55s on 2/22) Goal status:IN PROGRESS   3.  Pt will report toleration to activity to be up towards 2 hour before being limited by LB pain Baseline: 1 hour Goal status: INITIAL  4.  Pt worst LBP to be on or below 4/10 to demonstrate improved management of chronic pain Baseline: 7/10 Goal status: ongoing 04/29/22 (5/10)  5.  Pt will improve ROM of Lumbar spine in all areas by 25% to demonstrate improving function. Baseline: see chart Goal status: Met 04/29/22    PLAN:  PT FREQUENCY: 1-2x/week  PT DURATION: 10 weeks 15 [facility] per [facility]  PLANNED INTERVENTIONS: Therapeutic exercises, Therapeutic activity, Neuromuscular re-education, Balance training, Gait training, Patient/Family education, Self Care, Joint mobilization, Joint manipulation, Stair training, Orthotic/Fit training, DME instructions, Aquatic Therapy, Dry Needling, Electrical stimulation, Spinal manipulation, Spinal mobilization, Cryotherapy, Moist heat, Splintting, Taping, Traction, Ultrasound, Ionotophoresis '4mg'$ /ml Dexamethasone, Manual therapy, and Re-evaluation.  PLAN FOR NEXT SESSION: Aquatics for strengthening and ROM core and LE, balance retraining, stair climbing, proprioception retraining, toleration to activity.   [address] [place]) Holman Bonsignore MPT 04/29/22 1:27 PM [facility] [address] [place], [place], [address] Phone: [phone]   Fax:  [phone]

## 2022-05-03 ENCOUNTER — Encounter (HOSPITAL_BASED_OUTPATIENT_CLINIC_OR_DEPARTMENT_OTHER): Payer: Self-pay

## 2022-05-03 ENCOUNTER — Ambulatory Visit (HOSPITAL_BASED_OUTPATIENT_CLINIC_OR_DEPARTMENT_OTHER): Payer: No Typology Code available for payment source | Attending: Family Medicine

## 2022-05-03 DIAGNOSIS — M6281 Muscle weakness (generalized): Secondary | ICD-10-CM | POA: Diagnosis present

## 2022-05-03 DIAGNOSIS — G8929 Other chronic pain: Secondary | ICD-10-CM | POA: Insufficient documentation

## 2022-05-03 DIAGNOSIS — M25562 Pain in left knee: Secondary | ICD-10-CM | POA: Diagnosis present

## 2022-05-03 DIAGNOSIS — M5459 Other low back pain: Secondary | ICD-10-CM | POA: Insufficient documentation

## 2022-05-03 DIAGNOSIS — R2689 Other abnormalities of gait and mobility: Secondary | ICD-10-CM | POA: Insufficient documentation

## 2022-05-03 NOTE — Therapy (Signed)
[place] [facility]   Patient Name: Melinda Hayes MRN: [id number] DOB:[birth year], 67 y.o., female Today's Date: 05/03/2022  END OF SESSION:  PT End of Session - 05/03/22 1705     Visit Number 12    Number of Visits 15    Date for PT Re-Evaluation 05/14/22    Authorization Type [facility]    Authorization Time Period Department of [place]: [id number]  [facility] Contact: Birdie Riddle @ [phone] visits approved from 12.05.2023 - 04.03.2024    Progress Note Due on Visit 10    PT Start Time 1347    PT Stop Time 1430    PT Time Calculation (min) 43 min    Activity Tolerance Patient tolerated treatment well    Behavior During Therapy [facility] for tasks assessed/performed                Past Medical History:  Diagnosis Date   Hypertension    Rheumatoid arthritis ([place]) 2019   Past Surgical History:  Procedure Laterality Date   ABDOMINAL HYSTERECTOMY     BACK SURGERY     BIOPSY  04/13/2022   Procedure: BIOPSY;  Surgeon: Felicie Morn, MD;  Location: [facility];  Service: General;;   ESOPHAGOGASTRODUODENOSCOPY N/A 04/13/2022   Procedure: ESOPHAGOGASTRODUODENOSCOPY (EGD);  Surgeon: Felicie Morn, MD;  Location: [facility];  Service: General;  Laterality: N/A;   TOTAL KNEE ARTHROPLASTY Right 2016   Patient Active Problem List   Diagnosis Date Noted   Cervical radiculopathy 10/25/2017   Chronic midline low back pain without sciatica 10/25/2017    PCP: Myrla Halsted, MD  REFERRING PROVIDER: Myrla Halsted, MD   REFERRING DIAG: M54.50 (ICD-10-CM) - Low back pain, unspecified   Rationale for Evaluation and Treatment: M54.50 (ICD-10-CM) - Low back pain, unspecified   THERAPY DIAG:  Chronic pain of left knee  Other low back pain  Muscle weakness (generalized)  ONSET DATE: >2yr  SUBJECTIVE:                                                                                                                                                                                            SUBJECTIVE STATEMENT:  "My knee is just there. My back hurts a lot over the last couple days."  PERTINENT HISTORY:  Morbid obesity; HTN, L TKR  PAIN:  Are you having pain? yes: NPRS scale: 7/10 Knee; back 5/10 Pain location: see above Aggravating factors: certain exercises, stairs Relieving factors: sitting;  meds  PRECAUTIONS: Back and Fall  WEIGHT BEARING RESTRICTIONS: No  FALLS:  Has patient fallen in last 6 months?  Pt denies  LIVING ENVIRONMENT: Lives with: lives with their family Lives in: House/apartment Stairs: No Has following equipment at home: Single point cane  OCCUPATION: retired/disabled  PLOF: Independent with household mobility with device  PATIENT GOALS: lose weight; improve ability to walk, decrease pain  NEXT MD VISIT:   OBJECTIVE:   DIAGNOSTIC FINDINGS:  None in chart. 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 No tenderness noted.  LUMBAR ROM:  Pt limited in all Lumbar ROM by Pain: AROM eval 04/29/22  Flexion Finger tips below knees (~50%) Mid calf WFL  Extension 50% 75%  Right lateral flexion 50% 75%  Left lateral flexion 25% 75%  Right rotation    Left rotation     (Blank rows = not tested)  LOWER EXTREMITY ROM:     Active  Right eval Left eval Left 04/29/22  Hip flexion     Hip extension     Hip abduction     Hip adduction     Hip internal rotation     Hip external rotation     Knee flexion  90 105  Knee extension  0 0  Ankle dorsiflexion     Ankle  plantarflexion     Ankle inversion     Ankle eversion      (Blank rows = not tested)  LOWER EXTREMITY MMT:    MMT Right eval Left eval Right / Left 04-06-22 Right / Left 04/29/22  Hip flexion 3+ supine 3 4- / 4 4+ /4+  Hip extension      Hip abduction 5 5    Hip adduction 5 5    Hip internal rotation      Hip external rotation      Knee flexion 5 3+ P! 4-/5 P! 4+P!  Knee extension 5 4-P! 4+ P! 5-P!  Ankle dorsiflexion 5 5    Ankle plantarflexion      Ankle inversion      Ankle eversion       (Blank rows = not tested)  LUMBAR SPECIAL TESTS:  Straight leg raise test: Positive and Slump test: Negative  FUNCTIONAL TESTS:  5x Sts 31.05 armed chair Tug 33 with cane  04/06/22 5x STS 26s   04/22/22:  TUG with cane-22.55s     GAIT: Distance walked: 50 Assistive device utilized: Single point cane Level of assistance: Complete Independence Comments: antalgic limb lle, cane left side, decreased stance time lle  TODAY'S TREATMENT:                    05/03/22  Therapeutic exercise: NuStep, L3 x 5 min, UE/LE Manual stretch of piriformis and HS bilateral Hooklying PPTx10 LTR x10  STS from elevated high low table x 10 Standing row with TAC- GTB 2x10 Seated LAQ x 3-5sec hold, 2# x10 bilaterally   Side stepping around elevated high/low plinth x1ea L stretch at counter 10" x5  Previous: Objective measurements  Pt seen for aquatic therapy today.  Treatment took place in water 3.25-3.75 ft in depth at the [facility]. Temp of water was 92.  Pt entered/exited the pool via stairs using step-to pattern with bilat hand rail.   *With arms resting on white barbell:  walking forward/backward, side stepping, marching  * L stretch at wall last rep with tail wagging *KB row le staggered then wide stance 2x12 rep ea positions *SLS ue support white barbell R/L. After several tries able  to hold x 20s ea   *walking between tasks for recovery throughout session  * seated on bench:  STS x 10 w/o UE use; LAQ x20 R/L      Pt requires the buoyancy and hydrostatic pressure of water for support, and to offload joints by unweighting joint load by at least 50 % in navel deep water and by at least 75-80% in chest to neck deep water.  Viscosity of the water is needed for resistance of strengthening. Water current perturbations provides challenge to standing balance requiring increased core activation.  Previous land  Therapeutic exercise: NuStep, L3-4 x 4 min, UE/LE STS with UE on chair x 10 Seated bilat row with green band x 10  Seated LAQ x 3-5sec pause each LE, 2# added to LLE Seated quad stretch with single foot tucked under chair, sliding hips forward, x 15 sec x 2 reps each LE TUG  Seated cervical rotation and lateral flexion stretches, chin tucks  Side stepping L/R with UE on rail (60f each way,  x2) Standing bilat shoulder ext to neutral with core engaged x 11 Tandem gait forward/backward with RUE on counter  Shoulder flexion slide on cabinet x 2 L stretch at counter    PATIENT EDUCATION:  Education details: land exercise progressions/modifications Person educated: Patient Education method: Explanation Education comprehension: verbalized understanding  HOME EXERCISE PROGRAM: Access Code: [email or web address]: [email or web address] Date: 03/05/2022 Prepared by: FDenton MeekExercises - Supine Quad Set  - 1 x daily - 7 x weekly - 3 sets - 10 reps - Supine Posterior Pelvic Tilt  - 1 x daily - 7 x weekly - 3 sets - 10 reps  Added: Access Code: [email or web address]: 04/15/2022 Prepared by: FDenton Meek- Seated Scapular Retraction  - 1 x daily - 7 x weekly - 3 sets - 10 reps - Standing Scapular Depression  - 1 x daily - 7 x weekly - 3 sets - 10 reps - Seated Cervical Sidebending Stretch  - 1 x daily - 7 x weekly - 3 sets - 10 reps - Seated Cervical Rotation  AROM  - 1 x daily - 7 x weekly - 3 sets - 10 reps  ASSESSMENT:  CLINICAL IMPRESSION: Patient had good tolerance to therex program today without complaint of increased knee pain.  Patient has difficulty with sit to stand transfers from normal height surface and requires elevation to complete.  Performed bilateral piriformis and hamstring stretching to improve back pain.  Will assess response next visit.     OBJECTIVE IMPAIRMENTS: Abnormal gait, decreased activity tolerance, decreased balance, decreased endurance, decreased mobility, difficulty walking, decreased ROM, decreased strength, improper body mechanics, obesity, and pain.   ACTIVITY LIMITATIONS: carrying, lifting, bending, standing, squatting, stairs, transfers, and locomotion level  PARTICIPATION LIMITATIONS: meal prep, cleaning, laundry, shopping, community activity, and yard work  PERSONAL FACTORS: Age, Behavior pattern, Fitness, and 1-2 comorbidities: morbid obesity, HTN, RA  are also affecting patient's functional outcome.   REHAB POTENTIAL: Good  CLINICAL  DECISION MAKING: Evolving/moderate complexity  EVALUATION COMPLEXITY: Moderate   GOALS: Goals reviewed with patient? Yes  SHORT TERM GOALS: Target date: 04/09/22  Pt to tolerate full sessions of aquatic therapy without any increase in pain Baseline:TBA Goal status: Met 04/06/22  2.  Pt will improve on 5 X STS test to < 25s to demonstrate improving strength, transitional  movements and balance Baseline: 5x Sts 31.05 armed chair at eval; (26s) 04/06/22 Goal status: Ongoing   3.  Pt will use cane properly ( right hand) to decrease l knee pain and decrease LBP and improve safety/gait Baseline: Using cane in left hand (dominant) Goal status: Met 04/06/22  4.  Pt will improve strength in all areas where there is a deficit by at least 1/2 grade. Baseline: see chart Goal status: Met 04/06/22   LONG TERM GOALS: Target date: 05/14/22  Pt to meet Foto goal of 58% Baseline:  44% Goal status: INITIAL  2.  Pt will improve on TUG test to <or=20s to demonstrate improve LE function, mobility and decreased fall risk Baseline: Tug 33 with cane at eval; (22.55s on 2/22) Goal status:IN PROGRESS   3.  Pt will report toleration to activity to be up towards 2 hour before being limited by LB pain Baseline: 1 hour Goal status: INITIAL  4.  Pt worst LBP to be on or below 4/10 to demonstrate improved management of chronic pain Baseline: 7/10 Goal status: ongoing 04/29/22 (5/10)  5.  Pt will improve ROM of Lumbar spine in all areas by 25% to demonstrate improving function. Baseline: see chart Goal status: Met 04/29/22    PLAN:  PT FREQUENCY: 1-2x/week  PT DURATION: 10 weeks 15 visits as per [facility]  PLANNED INTERVENTIONS: Therapeutic exercises, Therapeutic activity, Neuromuscular re-education, Balance training, Gait training, Patient/Family education, Self Care, Joint mobilization, Joint manipulation, Stair training, Orthotic/Fit training, DME instructions, Aquatic Therapy, Dry Needling, Electrical stimulation, Spinal manipulation, Spinal mobilization, Cryotherapy, Moist heat, Splintting, Taping, Traction, Ultrasound, Ionotophoresis '4mg'$ /ml Dexamethasone, Manual therapy, and Re-evaluation.  PLAN FOR NEXT SESSION: Aquatics for strengthening and ROM core and LE, balance retraining, stair climbing, proprioception retraining, toleration to activity.   Sherlynn Carbon, PTA  05/03/22 5:08 PM [facility] [address] [place], [place], [address] Phone: [phone]   Fax:  [phone]

## 2022-05-04 ENCOUNTER — Ambulatory Visit (HOSPITAL_BASED_OUTPATIENT_CLINIC_OR_DEPARTMENT_OTHER): Payer: No Typology Code available for payment source | Admitting: Physical Therapy

## 2022-05-06 ENCOUNTER — Encounter (HOSPITAL_BASED_OUTPATIENT_CLINIC_OR_DEPARTMENT_OTHER): Payer: Self-pay | Admitting: Physical Therapy

## 2022-05-06 ENCOUNTER — Ambulatory Visit (HOSPITAL_BASED_OUTPATIENT_CLINIC_OR_DEPARTMENT_OTHER): Payer: No Typology Code available for payment source | Admitting: Physical Therapy

## 2022-05-06 DIAGNOSIS — M25562 Pain in left knee: Secondary | ICD-10-CM | POA: Diagnosis not present

## 2022-05-06 DIAGNOSIS — G8929 Other chronic pain: Secondary | ICD-10-CM

## 2022-05-06 DIAGNOSIS — M6281 Muscle weakness (generalized): Secondary | ICD-10-CM

## 2022-05-06 DIAGNOSIS — M5459 Other low back pain: Secondary | ICD-10-CM

## 2022-05-06 NOTE — Therapy (Signed)
OUTPATIENT PHYSICAL THERAPY [facility] TREAT[facility]   Patient Name: Melinda Hayes MRN: [id number] DOB:1955-08-16, 67 y.o., female Today's Date: 05/06/2022  END OF SESSION:  PT End of Session - 05/06/22 1043     Visit Number 13    Number of Visits 15    Date for PT Re-Evaluation 05/14/22    Authorization Type [facility]    Authorization Time Period Department of [place]: [id number]  [facility] ContactBirdie Riddle @ [phone] visits approved from 12.05.2023 - 04.03.2024    PT Start Time 1033    PT Stop Time 1114    PT Time Calculation (min) 41 min    Behavior During Therapy [facility] for tasks assessed/performed                Past Medical History:  Diagnosis Date   Hypertension    Rheumatoid arthritis ([place]) 2019   Past Surgical History:  Procedure Laterality Date   ABDOMINAL HYSTERECTOMY     BACK SURGERY     BIOPSY  04/13/2022   Procedure: BIOPSY;  Surgeon: Felicie Morn, MD;  Location: [facility];  Service: General;;   ESOPHAGOGASTRODUODENOSCOPY N/A 04/13/2022   Procedure: ESOPHAGOGASTRODUODENOSCOPY (EGD);  Surgeon: Felicie Morn, MD;  Location: [facility];  Service: General;  Laterality: N/A;   TOTAL KNEE ARTHROPLASTY Right 2016   Patient Active Problem List   Diagnosis Date Noted   Cervical radiculopathy 10/25/2017   Chronic midline low back pain without sciatica 10/25/2017    PCP: Myrla Halsted, MD  REFERRING PROVIDER: Myrla Halsted, MD   REFERRING DIAG: M54.50 (ICD-10-CM) - Low back pain, unspecified   Rationale for Evaluation and Treatment: M54.50 (ICD-10-CM) - Low back pain, unspecified   THERAPY DIAG:  Chronic pain of left knee  Other low back pain  Muscle weakness (generalized)  ONSET DATE: >18yr  SUBJECTIVE:                                                                                                                                                                                           SUBJECTIVE STATEMENT:   "I feel like my legs are getting stronger".   Pt states that [facility]    PERTINENT HISTORY:  Morbid obesity; HTN, L TKR  PAIN:  Are you having pain? yes: NPRS scale: 5/10 Knee; back  Pain location: see above Aggravating factors: certain exercises, stairs Relieving factors: sitting;  meds  PRECAUTIONS: Back and Fall  WEIGHT BEARING RESTRICTIONS: No  FALLS:  Has patient fallen in last 6 months?  Pt denies  LIVING ENVIRONMENT: Lives with: lives with their  family Lives in: House/apartment Stairs: No Has following equipment at home: Single point cane  OCCUPATION: retired/disabled  PLOF: Independent with household mobility with device  PATIENT GOALS: lose weight; improve ability to walk, decrease pain  NEXT MD VISIT:   OBJECTIVE:   DIAGNOSTIC FINDINGS:  None in chart. 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 No tenderness noted.  LUMBAR ROM:  Pt limited in all Lumbar ROM by Pain: AROM eval 04/29/22  Flexion Finger tips below knees (~50%) Mid calf WFL  Extension 50% 75%  Right lateral flexion 50% 75%  Left lateral flexion 25% 75%  Right rotation    Left rotation     (Blank rows = not tested)  LOWER EXTREMITY ROM:     Active  Right eval Left eval Left 04/29/22  Hip flexion     Hip extension     Hip abduction     Hip adduction     Hip internal rotation     Hip external rotation     Knee flexion  90 105  Knee extension  0 0  Ankle dorsiflexion     Ankle plantarflexion     Ankle inversion     Ankle eversion       (Blank rows = not tested)  LOWER EXTREMITY MMT:    MMT Right eval Left eval Right / Left 04-06-22 Right / Left 04/29/22  Hip flexion 3+ supine 3 4- / 4 4+ /4+  Hip extension      Hip abduction 5 5    Hip adduction 5 5    Hip internal rotation      Hip external rotation      Knee flexion 5 3+ P! 4-/5 P! 4+P!  Knee extension 5 4-P! 4+ P! 5-P!  Ankle dorsiflexion 5 5    Ankle plantarflexion      Ankle inversion      Ankle eversion       (Blank rows = not tested)  LUMBAR SPECIAL TESTS:  Straight leg raise test: Positive and Slump test: Negative  FUNCTIONAL TESTS:  5x Sts 31.05 armed chair Tug 33 with cane  04/06/22 5x STS 26s   04/22/22:  TUG with cane-22.55s     GAIT: Distance walked: 50 Assistive device utilized: Single point cane Level of assistance: Complete Independence Comments: antalgic limb lle, cane left side, decreased stance time lle  TODAY'S TREATMENT:          3/7                                               Pt seen for aquatic therapy today.  Treatment took place in water 3.25-3.75 ft in depth at the [place]. Temp of water was 92.  Pt entered/exited the pool via stairs using step-to pattern with bilat hand rail.   *Without support:  walking forward/backward, side stepping  * side stepping with arm addct with yellow hand floats  * walking backward  with single yellow hand float at side (challenge with it on Rt)  * staggered stance with kick board push pull 2 x 10 each LE forward * solid noodle pull down with TrA set  * forward/ back step x 10 leading with each foot;  box step R/ Box step L * holding wall:  squats x 10  * Rt forward step ups with LUE on wall x 10  Pt requires the buoyancy and hydrostatic pressure of water for support, and to offload joints by unweighting joint load by at least 50 % in navel deep water and by at least 75-80% in chest to neck deep water.  Viscosity of the water is needed for resistance of strengthening.  Water current perturbations provides challenge to standing balance requiring increased core activation.            05/03/22 Therapeutic exercise: NuStep, L3 x 5 min, UE/LE Manual stretch of piriformis and HS bilateral Hooklying PPTx10 LTR x10  STS from elevated high low table x 10 Standing row with TAC- GTB 2x10 Seated LAQ x 3-5sec hold, 2# x10 bilaterally   Side stepping around elevated high/low plinth x1ea L stretch at counter 10" x5    PATIENT EDUCATION:  Education details:aquatic exercise progressions/modifications Person educated: Patient Education method: Explanation Education comprehension: verbalized understanding  HOME EXERCISE PROGRAM: Access Code: [email or web address] URL: [email or web address] Date: 03/05/2022 Prepared by: Denton Meek Exercises - Supine Quad Set  - 1 x daily - 7 x weekly - 3 sets - 10 reps - Supine Posterior Pelvic Tilt  - 1 x daily - 7 x weekly - 3 sets - 10 reps  Added: Access Code: [email or web address] Date: 04/15/2022 Prepared by: Denton Meek - Seated Scapular Retraction  - 1 x daily - 7 x weekly - 3 sets - 10 reps - Standing Scapular Depression  - 1 x daily - 7 x weekly - 3 sets - 10 reps - Seated Cervical Sidebending Stretch  - 1 x daily - 7 x weekly - 3 sets - 10 reps - Seated Cervical Rotation AROM  - 1 x daily - 7 x weekly - 3 sets - 10 reps  ASSESSMENT:  CLINICAL IMPRESSION: Patient reported increased back pain after completing box step; reduced with change in exercise and rest.  Will trial bilateral piriformis and hamstring stretching in water, as it helped in land appt.  PT to assess goals and need for additional visits.  Pt has partially met her goals.      OBJECTIVE IMPAIRMENTS: Abnormal gait, decreased activity tolerance, decreased balance, decreased endurance, decreased mobility, difficulty walking, decreased ROM, decreased strength, improper body mechanics, obesity, and pain.   ACTIVITY LIMITATIONS: carrying, lifting,  bending, standing, squatting, stairs, transfers, and locomotion level  PARTICIPATION LIMITATIONS: meal prep, cleaning, laundry, shopping, community activity, and yard work  PERSONAL FACTORS: Age, Behavior pattern, Fitness, and 1-2 comorbidities: morbid obesity, HTN, RA  are also affecting patient's functional outcome.   REHAB POTENTIAL: Good  CLINICAL DECISION MAKING: Evolving/moderate complexity  EVALUATION COMPLEXITY: Moderate   GOALS: Goals reviewed with patient? Yes  SHORT TERM GOALS: Target date: 04/09/22  Pt to tolerate full sessions of aquatic therapy without any increase in pain Baseline:TBA Goal status: Met 04/06/22  2.  Pt will improve on 5 X STS test to < 25s to demonstrate improving strength, transitional  movements and balance Baseline: 5x Sts 31.05 armed chair at eval; (26s) 04/06/22 Goal status: Ongoing   3.  Pt will use cane properly ( right hand) to  decrease l knee pain and decrease LBP and improve safety/gait Baseline: Using cane in left hand (dominant) Goal status: Met 04/06/22  4.  Pt will improve strength in all areas where there is a deficit by at least 1/2 grade. Baseline: see chart Goal status: Met 04/06/22   LONG TERM GOALS: Target date: 05/14/22  Pt to meet Foto goal of 58% Baseline: 44% Goal status: INITIAL  2.  Pt will improve on TUG test to <or=20s to demonstrate improve LE function, mobility and decreased fall risk Baseline: Tug 33 with cane at eval; (22.55s on 2/22) Goal status:IN PROGRESS   3.  Pt will report toleration to activity to be up towards 2 hour before being limited by LB pain Baseline: 1 hour Goal status: INITIAL  4.  Pt worst LBP to be on or below 4/10 to demonstrate improved management of chronic pain Baseline: 7/10 Goal status: ongoing 04/29/22 (5/10)  5.  Pt will improve ROM of Lumbar spine in all areas by 25% to demonstrate improving function. Baseline: see chart Goal status: Met 04/29/22    PLAN:  PT FREQUENCY:  1-2x/week  PT DURATION: 10 weeks 15 [facility] per [facility]  PLANNED INTERVENTIONS: Therapeutic exercises, Therapeutic activity, Neuromuscular re-education, Balance training, Gait training, Patient/Family education, Self Care, Joint mobilization, Joint manipulation, Stair training, Orthotic/Fit training, DME instructions, Aquatic Therapy, Dry Needling, Electrical stimulation, Spinal manipulation, Spinal mobilization, Cryotherapy, Moist heat, Splintting, Taping, Traction, Ultrasound, Ionotophoresis '4mg'$ /ml Dexamethasone, Manual therapy, and Re-evaluation.  PLAN FOR NEXT SESSION: Aquatics for strengthening and ROM core and LE, balance retraining, stair climbing, proprioception retraining, toleration to activity.  [place] [facility].    Kerin Perna, PTA 05/06/22 11:15 AM [facility] [address] [place], [place], [address] Phone: [phone]   Fax:  [phone]

## 2022-05-11 ENCOUNTER — Encounter (HOSPITAL_BASED_OUTPATIENT_CLINIC_OR_DEPARTMENT_OTHER): Payer: Self-pay | Admitting: Physical Therapy

## 2022-05-11 ENCOUNTER — Ambulatory Visit (HOSPITAL_BASED_OUTPATIENT_CLINIC_OR_DEPARTMENT_OTHER): Payer: No Typology Code available for payment source | Admitting: Physical Therapy

## 2022-05-11 DIAGNOSIS — M5459 Other low back pain: Secondary | ICD-10-CM

## 2022-05-11 DIAGNOSIS — M25562 Pain in left knee: Secondary | ICD-10-CM | POA: Diagnosis not present

## 2022-05-11 DIAGNOSIS — G8929 Other chronic pain: Secondary | ICD-10-CM

## 2022-05-11 DIAGNOSIS — M6281 Muscle weakness (generalized): Secondary | ICD-10-CM

## 2022-05-11 DIAGNOSIS — R2689 Other abnormalities of gait and mobility: Secondary | ICD-10-CM

## 2022-05-11 NOTE — Therapy (Addendum)
PHYSICAL THERAPY DISCHARGE SUMMARY  Visits from Start of Care: 14  Current functional level related to goals / functional outcomes: unknown   Remaining deficits: Chronic pain   Education / Equipment: Management of condition/HEP   Patient agrees to discharge. Patient goals were partially met. Patient is being discharged due to not returning since the last visit.  OUTPATIENT PHYSICAL THERAPY THORACOLUMBAR TREATMEANT  Progress Note / Re-CERT Reporting Period 03/05/22 to 05/11/22  See note below for Objective Data and Assessment of Progress/Goals.     Patient Name: Melinda Hayes MRN: [id number] DOB:09/16/1955, 67 y.o., female Today's Date: 05/11/2022  END OF SESSION:  PT End of Session - 05/11/22 1347     Visit Number 14    Number of Visits 26    Date for PT Re-Evaluation 06/22/22    Authorization Type [facility]    Authorization Time Period Department of [facility]  [place]: [id number]  [facility] ContactBrunetta Jeans @ [phone]  15 visits approved from 12.05.2023 - 04.03.2024    PT Start Time 1031    PT Stop Time 1115    PT Time Calculation (min) 44 min    Activity Tolerance Patient tolerated treatment well    Behavior During Therapy [facility] for tasks assessed/performed                 Past Medical History:  Diagnosis Date   Hypertension    Rheumatoid arthritis (HCC) 2019   Past Surgical History:  Procedure Laterality Date   ABDOMINAL HYSTERECTOMY     BACK SURGERY     BIOPSY  04/13/2022   Procedure: BIOPSY;  Surgeon: Quentin Ore, MD;  Location: [facility];  Service: General;;   ESOPHAGOGASTRODUODENOSCOPY N/A 04/13/2022   Procedure: ESOPHAGOGASTRODUODENOSCOPY (EGD);  Surgeon: Quentin Ore, MD;  Location: [facility];  Service: General;  Laterality: N/A;   TOTAL KNEE ARTHROPLASTY Right 2016   Patient Active Problem List   Diagnosis Date Noted   Cervical radiculopathy 10/25/2017   Chronic midline low back pain without sciatica 10/25/2017     PCP: Wallace Cullens, MD  REFERRING PROVIDER: Wallace Cullens, MD   REFERRING DIAG: M54.50 (ICD-10-CM) - Low back pain, unspecified   Rationale for Evaluation and Treatment: M54.50 (ICD-10-CM) - Low back pain, unspecified   THERAPY DIAG:  Chronic pain of left knee  Other low back pain  Muscle weakness (generalized)  Other abnormalities of gait and mobility  ONSET DATE: >88yrs  SUBJECTIVE:  SUBJECTIVE STATEMENT:  Not a good day    PERTINENT HISTORY:  Morbid obesity; HTN, L TKR  PAIN:  Are you having pain? yes: NPRS scale: 6/10 Knee; back  Pain location: see above Aggravating factors: certain exercises, stairs Relieving factors: sitting;  meds  PRECAUTIONS: Back and Fall  WEIGHT BEARING RESTRICTIONS: No  FALLS:  Has patient fallen in last 6 months?  Pt denies  LIVING ENVIRONMENT: Lives with: lives with their family Lives in: House/apartment Stairs: No Has following equipment at home: Single point cane  OCCUPATION: retired/disabled  PLOF: Independent with household mobility with device  PATIENT GOALS: lose weight; improve ability to walk, decrease pain  NEXT MD VISIT:   OBJECTIVE:   DIAGNOSTIC FINDINGS:  None in chart. 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 No tenderness noted.  LUMBAR ROM:  Pt limited in all Lumbar ROM by Pain: AROM eval 04/29/22  05/11/22  Flexion Finger tips below knees (~50%) Mid calf WFL   Extension 50% 75% 75%  Right lateral flexion 50% 75% 75%  Left lateral flexion 25% 75% 50%  Right rotation   wfl  Left rotation   wfl   (Blank rows = not tested)  LOWER EXTREMITY ROM:     Active  Right eval Left eval Left 04/29/22  Hip flexion     Hip extension     Hip abduction     Hip adduction     Hip internal rotation     Hip external rotation     Knee flexion  90 105  Knee extension  0 0  Ankle dorsiflexion     Ankle plantarflexion     Ankle inversion     Ankle eversion      (Blank rows = not tested)  LOWER EXTREMITY MMT:        P=Pain MMT Right eval Left eval Right / Left 04-06-22 Right / Left 04/29/22 Right / Left 05/11/22  Hip flexion 3+ supine 3 4- / 4 4+ /4+ 4+ / 4+  Hip extension       Hip abduction 5 5     Hip adduction 5 5     Hip internal rotation       Hip external rotation       Knee flexion 5 3+ P! 4-/5 P! 4+P! 5 / 4+P  Knee extension 5 4-P! 4+ P! 5-P! 5P  Ankle dorsiflexion 5 5     Ankle plantarflexion       Ankle inversion       Ankle eversion        (Blank rows = not tested)  LUMBAR SPECIAL TESTS:  Straight leg raise test: Positive and Slump test: Negative  FUNCTIONAL TESTS:  5x Sts 31.05 armed chair Tug 33 with cane  04/06/22 5x STS 26s   04/22/22:  TUG with cane-22.55s    05/11/22: TUG:17.39        5x STS: 21.38     GAIT: Distance walked: 50 Assistive device utilized: Single point cane Level of assistance: Complete Independence Comments: antalgic limb lle, cane left side, decreased  stance time lle  TODAY'S TREATMENT:         Objective testing Foto Re-assess                                               Pt seen for aquatic therapy today.  Treatment took place in water 3.25-3.75 ft in depth at the [facility] pool. Temp of water was 92.  Pt entered/exited the pool via stairs using step-to pattern with bilat hand rail.   *Without support:  walking  forward/backward, side stepping  * L stretch  * walking backward with single yellow hand float at side (challenge with it on Rt)  *yellow noodle vertical suspension for decompression; add/abd; cycling * solid noodle pull down with TrA set  * holding wall:  squats x 10  * Rt forward step ups with LUE on wall x 10  Pt requires the buoyancy and hydrostatic pressure of water for support, and to offload joints by unweighting joint load by at least 50 % in navel deep water and by at least 75-80% in chest to neck deep water.  Viscosity of the water is needed for resistance of strengthening. Water current perturbations provides challenge to standing balance requiring increased core activation.            05/03/22 Therapeutic exercise: NuStep, L3 x 5 min, UE/LE Manual stretch of piriformis and HS bilateral Hooklying PPTx10 LTR x10  STS from elevated high low table x 10 Standing row with TAC- GTB 2x10 Seated LAQ x 3-5sec hold, 2# x10 bilaterally   Side stepping around elevated high/low plinth x1ea L stretch at counter 10" x5    PATIENT EDUCATION:  Education details:aquatic exercise progressions/modifications Person educated: Patient Education method: Explanation Education comprehension: verbalized understanding  HOME EXERCISE PROGRAM: Access Code: [email or web address] URL: [email or web address] Date: 03/05/2022 Prepared by: Geni Bers Exercises - Supine Quad Set  - 1 x daily - 7 x weekly - 3 sets - 10 reps - Supine Posterior Pelvic Tilt  - 1 x daily - 7 x weekly - 3 sets - 10 reps  Added: Access Code: [email or web address] Date: 04/15/2022 Prepared by: Geni Bers - Seated Scapular Retraction  - 1 x daily - 7 x weekly - 3 sets - 10 reps - Standing Scapular Depression  - 1 x daily - 7 x weekly - 3 sets - 10 reps - Seated Cervical Sidebending Stretch  - 1 x daily - 7 x weekly - 3 sets - 10 reps - Seated Cervical Rotation AROM  - 1 x daily - 7 x weekly - 3 sets - 10  reps  ASSESSMENT:  CLINICAL IMPRESSION: PN: pt has demonstrated over the past few weeks good improvements in functional mobility and overall decreasing pain.  She does presents today with some increased LBP. Knee pain controlled. Despite pain she demonstrates improvement on all functional tests meeting 2 goals. She does have a decrease in her Foto score reflective of increase pain symptoms today.  She will benefit from continued skilled PT but with transition from pool to land based to meet remaining goals and establish land based HEP. Plan to see her 2 more times in pool to assign and instruct on final aquatic HEP.  Goals Not met due to not returning for servces       OBJECTIVE IMPAIRMENTS: Abnormal gait, decreased activity tolerance, decreased balance, decreased endurance, decreased  mobility, difficulty walking, decreased ROM, decreased strength, improper body mechanics, obesity, and pain.   ACTIVITY LIMITATIONS: carrying, lifting, bending, standing, squatting, stairs, transfers, and locomotion level  PARTICIPATION LIMITATIONS: meal prep, cleaning, laundry, shopping, community activity, and yard work  PERSONAL FACTORS: Age, Behavior pattern, Fitness, and 1-2 comorbidities: morbid obesity, HTN, RA  are also affecting patient's functional outcome.   REHAB POTENTIAL: Good  CLINICAL DECISION MAKING: Evolving/moderate complexity  EVALUATION COMPLEXITY: Moderate   GOALS: Goals reviewed with patient? Yes  SHORT TERM GOALS: Target date: 04/09/22  Pt to tolerate full sessions of aquatic therapy without any increase in pain Baseline:TBA Goal status: Met 04/06/22  2.  Pt will improve on 5 X STS test to < 25s to demonstrate improving strength, transitional  movements and balance Baseline: 5x Sts 31.05 armed chair at eval; (26s) 04/06/22; (21.38) 3/12 Goal status: Met 05/11/22   3.  Pt will use cane properly ( right hand) to decrease l knee pain and decrease LBP and improve  safety/gait Baseline: Using cane in left hand (dominant) Goal status: Met 04/06/22  4.  Pt will improve strength in all areas where there is a deficit by at least 1/2 grade. Baseline: see chart Goal status: Met 04/06/22   LONG TERM GOALS: Target date: 05/14/22  Pt to meet Foto goal of 58% Baseline: 44% Goal status: ongoing 05/11/22  2.  Pt will improve on TUG test to <or=20s to demonstrate improve LE function, mobility and decreased fall risk Baseline: Tug 33 with cane at eval; (22.55s on 2/22); (17.39) 05/11/22 Goal status:Met 05/11/22   3.  Pt will report toleration to activity to be up towards 2 hour before being limited by LB pain Baseline: 1 hour Goal status: INITIAL  4.  Pt worst LBP to be on or below 4/10 to demonstrate improved management of chronic pain Baseline: 7/10 Goal status: ongoing 04/29/22 (5/10)  5.  Pt will improve ROM of Lumbar spine in all areas by 25% to demonstrate improving function. Baseline: see chart Goal status: Met 04/29/22    PLAN:  PT FREQUENCY: 1-2x/week  PT DURATION: 6 weeks  PLANNED INTERVENTIONS: Therapeutic exercises, Therapeutic activity, Neuromuscular re-education, Balance training, Gait training, Patient/Family education, Self Care, Joint mobilization, Joint manipulation, Stair training, Orthotic/Fit training, DME instructions, Aquatic Therapy, Dry Needling, Electrical stimulation, Spinal manipulation, Spinal mobilization, Cryotherapy, Moist heat, Splintting, Taping, Traction, Ultrasound, Ionotophoresis 4mg /ml Dexamethasone, Manual therapy, and Re-evaluation.  PLAN FOR NEXT SESSION: Aquatics for strengthening and ROM core and LE, balance retraining, stair climbing, proprioception retraining, toleration to activity.  [place] HEP.    [address] [place]) Kruze Atchley MPT 05/11/22 3:17 PM [facility] [address] [place], [place], [address] Phone: [phone]   Fax:  [phone]  Addend Rushie Chestnut) Reyne Falconi MPT 08/06/22 [phone]

## 2022-05-13 ENCOUNTER — Ambulatory Visit: Payer: Self-pay | Admitting: Surgery

## 2022-05-13 DIAGNOSIS — Z01818 Encounter for other preprocedural examination: Secondary | ICD-10-CM

## 2022-05-17 ENCOUNTER — Encounter: Payer: No Typology Code available for payment source | Attending: Surgery | Admitting: Skilled Nursing Facility1

## 2022-05-17 ENCOUNTER — Encounter: Payer: Self-pay | Admitting: Skilled Nursing Facility1

## 2022-05-17 VITALS — Wt 285.7 lb

## 2022-05-17 DIAGNOSIS — Z713 Dietary counseling and surveillance: Secondary | ICD-10-CM | POA: Insufficient documentation

## 2022-05-17 DIAGNOSIS — E669 Obesity, unspecified: Secondary | ICD-10-CM

## 2022-05-17 NOTE — Progress Notes (Signed)
COVID Vaccine Completed:  Date of COVID positive in last 90 days:  PCP - [facility] Cardiologist -   Chest x-ray - 05-19-22 Epic EKG - 05-19-22 Epic Stress Test -  ECHO -  Cardiac Cath -  Pacemaker/ICD device last checked: Spinal Cord Stimulator:  Bowel Prep -   Sleep Study -  CPAP -   Fasting Blood Sugar -  Checks Blood Sugar _____ times a day  Last dose of GLP1 agonist-  N/A GLP1 instructions:  N/A   Last dose of SGLT-2 inhibitors-  N/A SGLT-2 instructions: N/A   Blood Thinner Instructions: Aspirin Instructions: Last Dose:  Activity level:  Can go up a flight of stairs and perform activities of daily living without stopping and without symptoms of chest pain or shortness of breath.  Able to exercise without symptoms  Unable to go up a flight of stairs without symptoms of     Anesthesia review:   Patient denies shortness of breath, fever, cough and chest pain at PAT appointment  Patient verbalized understanding of instructions that were given to them at the PAT appointment. Patient was also instructed that they will need to review over the PAT instructions again at home before surgery.

## 2022-05-17 NOTE — Progress Notes (Signed)
Pre-Operative Nutrition Class:    Patient was seen on 05/17/2022 for Pre-Operative Bariatric Surgery Education at [facility].    Surgery date: 05/31/2022 Surgery type: sleeve Start weight at [facility]: 291.2 Weight today: 285.7  Samples given per MNT protocol. Patient educated on appropriate usage: Ensure protein shake: June/03/2022; [id number] Procare multivitamin: 09/26; 9932 Procare calcium: 02/04/2025PK:[id number]   The following the learning objectives were met by the patient during this course: Identify Pre-Op Dietary Goals and will begin 2 weeks pre-operatively Identify appropriate sources of fluids and proteins  State protein recommendations and appropriate sources pre and post-operatively Identify Post-Operative Dietary Goals and will follow for 2 weeks post-operatively Identify appropriate multivitamin and calcium sources Describe the need for physical activity post-operatively and will follow MD recommendations State when to call healthcare provider regarding medication questions or post-operative complications When having a diagnosis of diabetes understanding hypoglycemia symptoms and the inclusion of 1 complex carbohydrate per meal  Handouts given during class include: Pre-Op Bariatric Surgery Diet Handout Protein Shake Handout Post-Op Bariatric Surgery Nutrition Handout BELT Program Information Flyer Support Group Information Flyer [facility] Bariatric Supplements Price List  Follow-Up Plan: Patient will follow-up at [facility] 2 weeks post operatively for diet advancement per MD.

## 2022-05-17 NOTE — Patient Instructions (Signed)
SURGICAL WAITING ROOM VISITATION Patients having surgery or a procedure may have no more than 2 support people in the waiting area - these visitors may rotate.    If the patient needs to stay at the hospital during part of their recovery, the visitor guidelines for inpatient rooms apply. Pre-op nurse will coordinate an appropriate time for 1 support person to accompany patient in pre-op.  This support person may not rotate.    Please refer to the [facility] website for the visitor guidelines for Inpatients (after your surgery is over and you are in a regular room).   Due to an increase in RSV and influenza rates and associated hospitalizations, children ages 8 and under may not visit patients in [place].     Your procedure is scheduled on: 05-31-22   Report to [facility] Main Entrance    Report to admitting at 5:15 AM   Call this number if you have problems the morning of surgery [phone]   Do not eat food after :6:00 PM.   After Midnight you may have the following liquids until 4:30 AM DAY OF SURGERY  Water Non-Citrus Juices (without pulp, NO RED) Carbonated Beverages Black Coffee (NO MILK/CREAM OR CREAMERS, sugar ok)  Clear Tea (NO MILK/CREAM OR CREAMERS, sugar ok) regular and decaf                             Plain Jell-O (NO RED)                                           Fruit ices (not with fruit pulp, NO RED)                                     Popsicles (NO RED)                                                               Sports drinks like Gatorade (NO RED)                       If you have questions, please contact your surgeon's office.   FOLLOW BOWEL PREP AND ANY ADDITIONAL PRE OP INSTRUCTIONS YOU RECEIVED FROM YOUR SURGEON'S OFFICE!!!     Oral Hygiene is also important to reduce your risk of infection.                                    Remember - BRUSH YOUR TEETH THE MORNING OF SURGERY WITH YOUR REGULAR TOOTHPASTE   Do NOT smoke  after Midnight   Take these medicines the morning of surgery with A SIP OF WATER: Amlodipine Ezetimibe Tylenol if needed   DO NOT TAKE ANY ORAL DIABETIC MEDICATIONS DAY OF YOUR SURGERY  Bring CPAP mask and tubing day of surgery.  You may not have any metal on your body including hair pins, jewelry, and body piercing             Do not wear make-up, lotions, powders, perfumes or deodorant  Do not wear nail polish including gel and S&S, artificial/acrylic nails, or any other type of covering on natural nails including finger and toenails. If you have artificial nails, gel coating, etc. that needs to be removed by a nail salon please have this removed prior to surgery or surgery may need to be canceled/ delayed if the surgeon/ anesthesia feels like they are unable to be safely monitored.   Do not shave  48 hours prior to surgery.         Do not bring valuables to the hospital. [place].   Contacts, dentures or bridgework may not be worn into surgery.   Bring small overnight bag day of surgery.   DO NOT [place]. PHARMACY WILL DISPENSE MEDICATIONS LISTED ON YOUR MEDICATION LIST TO YOU DURING YOUR ADMISSION [place]!    Special Instructions: Bring a copy of your healthcare power of attorney and living will documents the day of surgery if you haven't scanned them before.              Please read over the following fact sheets you were given: IF [place]  If you received a COVID test during your pre-op visit  it is requested that you wear a mask when out in public, stay away from anyone that may not be feeling well and notify your surgeon if you develop symptoms. If you test positive for Covid or have been in contact with anyone that has tested positive in the last 10 days please notify you surgeon.  [place] -  Preparing for Surgery Before surgery, you can play an important role.  Because skin is not sterile, your skin needs to be as free of germs as possible.  You can reduce the number of germs on your skin by washing with CHG (chlorahexidine gluconate) soap before surgery.  CHG is an antiseptic cleaner which kills germs and bonds with the skin to continue killing germs even after washing. Please DO NOT use if you have an allergy to CHG or antibacterial soaps.  If your skin becomes reddened/irritated stop using the CHG and inform your nurse when you arrive at Short Stay. Do not shave (including legs and underarms) for at least 48 hours prior to the first CHG shower.  You may shave your face/neck.  Please follow these instructions carefully:  1.  Shower with CHG Soap the night before surgery and the  morning of surgery.  2.  If you choose to wash your hair, wash your hair first as usual with your normal  shampoo.  3.  After you shampoo, rinse your hair and body thoroughly to remove the shampoo.                             4.  Use CHG as you would any other liquid soap.  You can apply chg directly to the skin and wash.  Gently with a scrungie or clean washcloth.  5.  Apply the CHG Soap to your body ONLY FROM THE NECK DOWN.   Do   not use on face/ open  Wound or open sores. Avoid contact with eyes, ears mouth and   genitals (private parts).                       Wash face,  Genitals (private parts) with your normal soap.             6.  Wash thoroughly, paying special attention to the area where your    surgery  will be performed.  7.  Thoroughly rinse your body with warm water from the neck down.  8.  DO NOT shower/wash with your normal soap after using and rinsing off the CHG Soap.                9.  Pat yourself dry with a clean towel.            10.  Wear clean pajamas.            11.  Place clean sheets on your bed the night of your first shower and do not  sleep with  pets. Day of Surgery : Do not apply any lotions/deodorants the morning of surgery.  Please wear clean clothes to the hospital/surgery center.  FAILURE TO FOLLOW THESE INSTRUCTIONS MAY RESULT IN THE CANCELLATION OF YOUR SURGERY  PATIENT SIGNATURE_________________________________  NURSE SIGNATURE__________________________________  ________________________________________________________________________     Melinda Hayes  An incentive spirometer is a tool that can help keep your lungs clear and active. This tool measures how well you are filling your lungs with each breath. Taking long deep breaths may help reverse or decrease the chance of developing breathing (pulmonary) problems (especially infection) following: A long period of time when you are unable to move or be active. BEFORE THE PROCEDURE  If the spirometer includes an indicator to show your best effort, your nurse or respiratory therapist will set it to a desired goal. If possible, sit up straight or lean slightly forward. Try not to slouch. Hold the incentive spirometer in an upright position. INSTRUCTIONS FOR USE  Sit on the edge of your bed if possible, or sit up as far as you can in bed or on a chair. Hold the incentive spirometer in an upright position. Breathe out normally. Place the mouthpiece in your mouth and seal your lips tightly around it. Breathe in slowly and as deeply as possible, raising the piston or the ball toward the top of the column. Hold your breath for 3-5 seconds or for as long as possible. Allow the piston or ball to fall to the bottom of the column. Remove the mouthpiece from your mouth and breathe out normally. Rest for a few seconds and repeat Steps 1 through 7 at least 10 times every 1-2 hours when you are awake. Take your time and take a few normal breaths between deep breaths. The spirometer may include an indicator to show your best effort. Use the indicator as a goal to work toward  during each repetition. After each set of 10 deep breaths, practice coughing to be sure your lungs are clear. If you have an incision (the cut made at the time of surgery), support your incision when coughing by placing a pillow or rolled up towels firmly against it. Once you are able to get out of bed, walk around indoors and cough well. You may stop using the incentive spirometer when instructed by your caregiver.  RISKS AND COMPLICATIONS Take your time so you do not get dizzy or light-headed. If you are in  pain, you may need to take or ask for pain medication before doing incentive spirometry. It is harder to take a deep breath if you are having pain. AFTER USE Rest and breathe slowly and easily. It can be helpful to keep track of a log of your progress. Your caregiver can provide you with a simple table to help with this. If you are using the spirometer at home, follow these instructions: [place] IF:  You are having difficultly using the spirometer. You have trouble using the spirometer as often as instructed. Your pain medication is not giving enough relief while using the spirometer. You develop fever of 100.5 F (38.1 C) or higher. SEEK IMMEDIATE MEDICAL CARE IF:  You cough up bloody sputum that had not been present before. You develop fever of 102 F (38.9 C) or greater. You develop worsening pain at or near the incision site. MAKE SURE YOU:  Understand these instructions. Will watch your condition. Will get help right away if you are not doing well or get worse. Document Released: 06/28/2006 Document Revised: 05/10/2011 Document Reviewed: 08/29/2006 [facility] Patient Information 2014 [place], [place].   ________________________________________________________________________

## 2022-05-19 ENCOUNTER — Ambulatory Visit (HOSPITAL_COMMUNITY)
Admission: RE | Admit: 2022-05-19 | Discharge: 2022-05-19 | Disposition: A | Discharge status: Home / Self Care | Payer: No Typology Code available for payment source | Source: Ambulatory Visit | Attending: Anesthesiology | Admitting: Anesthesiology

## 2022-05-19 ENCOUNTER — Encounter (HOSPITAL_COMMUNITY): Payer: Self-pay

## 2022-05-19 ENCOUNTER — Other Ambulatory Visit: Payer: Self-pay

## 2022-05-19 ENCOUNTER — Encounter (HOSPITAL_COMMUNITY)
Admission: RE | Admit: 2022-05-19 | Discharge: 2022-05-19 | Disposition: A | Discharge status: Home / Self Care | Payer: No Typology Code available for payment source | Source: Ambulatory Visit | Attending: Surgery | Admitting: Surgery

## 2022-05-19 VITALS — BP 162/86 | HR 80 | Temp 98.7°F | Resp 20 | Ht 67.0 in | Wt 281.4 lb

## 2022-05-19 DIAGNOSIS — I10 Essential (primary) hypertension: Secondary | ICD-10-CM

## 2022-05-19 DIAGNOSIS — Z01818 Encounter for other preprocedural examination: Secondary | ICD-10-CM | POA: Diagnosis present

## 2022-05-19 HISTORY — DX: Family history of other specified conditions: Z84.89

## 2022-05-19 HISTORY — DX: Pneumonia, unspecified organism: J18.9

## 2022-05-19 HISTORY — DX: Anemia, unspecified: D64.9

## 2022-05-19 LAB — COMPREHENSIVE METABOLIC PANEL
ALT: 15 U/L (ref 0–44)
AST: 15 U/L (ref 15–41)
Albumin: 3.8 g/dL (ref 3.5–5.0)
Alkaline Phosphatase: 92 U/L (ref 38–126)
Anion gap: 4 — ABNORMAL LOW (ref 5–15)
BUN: 17 mg/dL (ref 8–23)
CO2: 24 mmol/L (ref 22–32)
Calcium: 8.8 mg/dL — ABNORMAL LOW (ref 8.9–10.3)
Chloride: 109 mmol/L (ref 98–111)
Creatinine, Ser: 0.8 mg/dL (ref 0.44–1.00)
GFR, Estimated: >60 mL/min (ref >60)
Glucose, Bld: 102 mg/dL — ABNORMAL HIGH (ref 70–99)
Potassium: 3.8 mmol/L (ref 3.5–5.1)
Sodium: 137 mmol/L (ref 135–145)
Total Bilirubin: 0.5 mg/dL (ref 0.3–1.2)
Total Protein: 7.3 g/dL (ref 6.5–8.1)

## 2022-05-19 LAB — CBC WITH DIFFERENTIAL/PLATELET
Abs Immature Granulocytes: 0.04 10*3/uL (ref 0.00–0.07)
Basophils Absolute: 0.1 10*3/uL (ref 0.0–0.1)
Basophils Relative: 1 %
Eosinophils Absolute: 0.3 10*3/uL (ref 0.0–0.5)
Eosinophils Relative: 4 %
HCT: 39.5 % (ref 36.0–46.0)
Hemoglobin: 12.4 g/dL (ref 12.0–15.0)
Immature Granulocytes: 1 %
Lymphocytes Relative: 37 %
Lymphs Abs: 2.7 10*3/uL (ref 0.7–4.0)
MCH: 26.1 pg (ref 26.0–34.0)
MCHC: 31.4 g/dL (ref 30.0–36.0)
MCV: 83 fL (ref 80.0–100.0)
Monocytes Absolute: 0.6 10*3/uL (ref 0.1–1.0)
Monocytes Relative: 7 %
Neutro Abs: 3.8 10*3/uL (ref 1.7–7.7)
Neutrophils Relative %: 50 %
Platelets: 330 10*3/uL (ref 150–400)
RBC: 4.76 MIL/uL (ref 3.87–5.11)
RDW: 16 % — ABNORMAL HIGH (ref 11.5–15.5)
WBC: 7.5 10*3/uL (ref 4.0–10.5)
nRBC: 0 % (ref 0.0–0.2)

## 2022-05-19 LAB — TYPE AND SCREEN
ABO/RH(D): B POS
Antibody Screen: NEGATIVE

## 2022-05-28 ENCOUNTER — Encounter (HOSPITAL_COMMUNITY): Payer: Self-pay | Admitting: Surgery

## 2022-05-28 NOTE — Anesthesia Preprocedure Evaluation (Signed)
Anesthesia Evaluation  Patient identified by MRN, date of birth, ID band Patient awake    Reviewed: Allergy & Precautions, NPO status , Patient's Chart, lab work & pertinent test results, reviewed documented beta blocker date and time   History of Anesthesia Complications (+) Family history of anesthesia reaction  Airway Mallampati: II  TM Distance: >3 FB Neck ROM: Full    Dental  (+) Partial Upper, Dental Advisory Given, Caps   Pulmonary pneumonia, resolved   Pulmonary exam normal breath sounds clear to auscultation       Cardiovascular hypertension, Pt. on medications Normal cardiovascular exam Rhythm:Regular Rate:Normal  EKG 05/19/22 NSR, LAE, possible LVH   Neuro/Psych  Neuromuscular disease  negative psych ROS   GI/Hepatic negative GI ROS, Neg liver ROS,,,  Endo/Other    Morbid obesity  Renal/GU negative Renal ROS  negative genitourinary   Musculoskeletal  (+) Arthritis , Osteoarthritis,  Cervical radiculopathy   Abdominal  (+) + obese  Peds  Hematology  (+) Blood dyscrasia, anemia   Anesthesia Other Findings   Reproductive/Obstetrics                              Anesthesia Physical Anesthesia Plan  ASA: 3  Anesthesia Plan: General   Post-op Pain Management: Dilaudid IV, Precedex and Tylenol PO (pre-op)*   Induction: Intravenous  PONV Risk Score and Plan: 4 or greater and Treatment may vary due to age or medical condition, Scopolamine patch - Pre-op, Midazolam, Ondansetron and Dexamethasone  Airway Management Planned: Oral ETT  Additional Equipment: None  Intra-op Plan:   Post-operative Plan: Extubation in OR  Informed Consent: I have reviewed the patients History and Physical, chart, labs and discussed the procedure including the risks, benefits and alternatives for the proposed anesthesia with the patient or authorized representative who has indicated his/her  understanding and acceptance.     Dental advisory given  Plan Discussed with: CRNA and Anesthesiologist  Anesthesia Plan Comments:          Anesthesia Quick Evaluation

## 2022-05-31 ENCOUNTER — Other Ambulatory Visit: Payer: Self-pay

## 2022-05-31 ENCOUNTER — Inpatient Hospital Stay (HOSPITAL_COMMUNITY): Payer: No Typology Code available for payment source | Admitting: Anesthesiology

## 2022-05-31 ENCOUNTER — Encounter (HOSPITAL_COMMUNITY): Payer: Self-pay | Admitting: Surgery

## 2022-05-31 ENCOUNTER — Encounter (HOSPITAL_COMMUNITY)
Admission: RE | Disposition: A | Discharge status: Home / Self Care | Payer: Self-pay | Source: Home / Self Care | Attending: Surgery

## 2022-05-31 ENCOUNTER — Inpatient Hospital Stay (HOSPITAL_COMMUNITY)
Admission: RE | Admit: 2022-05-31 | Discharge: 2022-06-01 | DRG: 621 | Disposition: A | Discharge status: Home / Self Care | Payer: No Typology Code available for payment source | Attending: Surgery | Admitting: Surgery

## 2022-05-31 DIAGNOSIS — Z96651 Presence of right artificial knee joint: Secondary | ICD-10-CM | POA: Diagnosis present

## 2022-05-31 DIAGNOSIS — G709 Myoneural disorder, unspecified: Secondary | ICD-10-CM

## 2022-05-31 DIAGNOSIS — Z90710 Acquired absence of both cervix and uterus: Secondary | ICD-10-CM | POA: Diagnosis not present

## 2022-05-31 DIAGNOSIS — I10 Essential (primary) hypertension: Secondary | ICD-10-CM

## 2022-05-31 DIAGNOSIS — Z79899 Other long term (current) drug therapy: Secondary | ICD-10-CM | POA: Diagnosis not present

## 2022-05-31 DIAGNOSIS — Z01818 Encounter for other preprocedural examination: Secondary | ICD-10-CM

## 2022-05-31 DIAGNOSIS — Z6841 Body Mass Index (BMI) 40.0 and over, adult: Secondary | ICD-10-CM

## 2022-05-31 DIAGNOSIS — M069 Rheumatoid arthritis, unspecified: Secondary | ICD-10-CM | POA: Diagnosis present

## 2022-05-31 DIAGNOSIS — Z888 Allergy status to other drugs, medicaments and biological substances status: Secondary | ICD-10-CM

## 2022-05-31 HISTORY — PX: UPPER GI ENDOSCOPY: SHX6162

## 2022-05-31 LAB — CBC
HCT: 37.4 % (ref 36.0–46.0)
Hemoglobin: 11.7 g/dL — ABNORMAL LOW (ref 12.0–15.0)
MCH: 25.4 pg — ABNORMAL LOW (ref 26.0–34.0)
MCHC: 31.3 g/dL (ref 30.0–36.0)
MCV: 81.3 fL (ref 80.0–100.0)
Platelets: 276 10*3/uL (ref 150–400)
RBC: 4.6 MIL/uL (ref 3.87–5.11)
RDW: 15.9 % — ABNORMAL HIGH (ref 11.5–15.5)
WBC: 10.1 10*3/uL (ref 4.0–10.5)
nRBC: 0 % (ref 0.0–0.2)

## 2022-05-31 LAB — HEMOGLOBIN AND HEMATOCRIT, BLOOD
HCT: 38.1 % (ref 36.0–46.0)
Hemoglobin: 11.7 g/dL — ABNORMAL LOW (ref 12.0–15.0)

## 2022-05-31 LAB — SURGICAL PATHOLOGY

## 2022-05-31 LAB — CREATININE, SERUM
Creatinine, Ser: 0.99 mg/dL (ref 0.44–1.00)
GFR, Estimated: >60 mL/min (ref >60)

## 2022-05-31 LAB — ABO/RH: ABO/RH(D): B POS

## 2022-05-31 SURGERY — XI ROBOTIC GASTRIC SLEEVE RESECTION
Anesthesia: General | Site: Abdomen

## 2022-05-31 MED ORDER — PROPOFOL 10 MG/ML IV BOLUS
INTRAVENOUS | Status: AC
  Filled 2022-05-31: qty 20

## 2022-05-31 MED ORDER — OXYCODONE HCL 5 MG PO TABS
ORAL_TABLET | ORAL | Status: AC
  Administered 2022-05-31: 5 mg via ORAL
  Filled 2022-05-31: qty 1

## 2022-05-31 MED ORDER — ACETAMINOPHEN 160 MG/5ML PO SOLN
1000.0000 mg | Freq: Three times a day (TID) | ORAL | Status: DC
  Administered 2022-05-31: 1000 mg via ORAL
  Filled 2022-05-31: qty 40.6

## 2022-05-31 MED ORDER — SUGAMMADEX SODIUM 500 MG/5ML IV SOLN
INTRAVENOUS | Status: AC
  Filled 2022-05-31: qty 5

## 2022-05-31 MED ORDER — LIDOCAINE HCL 2 % IJ SOLN
INTRAMUSCULAR | Status: AC
  Filled 2022-05-31: qty 20

## 2022-05-31 MED ORDER — CHLORHEXIDINE GLUCONATE 0.12 % MT SOLN
15.0000 mL | Freq: Once | OROMUCOSAL | Status: AC
  Administered 2022-05-31: 15 mL via OROMUCOSAL

## 2022-05-31 MED ORDER — EZETIMIBE 10 MG PO TABS
5.0000 mg | ORAL_TABLET | Freq: Every morning | ORAL | Status: DC
  Administered 2022-06-01: 5 mg via ORAL
  Filled 2022-05-31: qty 1

## 2022-05-31 MED ORDER — BUPIVACAINE HCL 0.25 % IJ SOLN
INTRAMUSCULAR | Status: AC
  Filled 2022-05-31: qty 1

## 2022-05-31 MED ORDER — PHENYLEPHRINE HCL-NACL 20-0.9 MG/250ML-% IV SOLN
INTRAVENOUS | Status: DC | PRN
  Administered 2022-05-31: 25 ug/min via INTRAVENOUS

## 2022-05-31 MED ORDER — HYDROMORPHONE HCL 1 MG/ML IJ SOLN
INTRAMUSCULAR | Status: AC
  Administered 2022-05-31: 0.5 mg via INTRAVENOUS
  Filled 2022-05-31: qty 1

## 2022-05-31 MED ORDER — ACETAMINOPHEN 500 MG PO TABS
1000.0000 mg | ORAL_TABLET | Freq: Three times a day (TID) | ORAL | Status: DC
  Administered 2022-05-31 – 2022-06-01 (×3): 1000 mg via ORAL
  Filled 2022-05-31 (×3): qty 2

## 2022-05-31 MED ORDER — BUPIVACAINE LIPOSOME 1.3 % IJ SUSP: 20.0000 mL | Freq: Once | INTRAMUSCULAR | Status: DC

## 2022-05-31 MED ORDER — ROCURONIUM BROMIDE 10 MG/ML (PF) SYRINGE
PREFILLED_SYRINGE | INTRAVENOUS | Status: AC
  Filled 2022-05-31: qty 10

## 2022-05-31 MED ORDER — ONDANSETRON HCL 4 MG/2ML IJ SOLN: 4.0000 mg | Freq: Once | INTRAMUSCULAR | Status: DC | PRN

## 2022-05-31 MED ORDER — AMLODIPINE BESYLATE 10 MG PO TABS
10.0000 mg | ORAL_TABLET | Freq: Every morning | ORAL | Status: DC
  Administered 2022-06-01: 10 mg via ORAL
  Filled 2022-05-31: qty 1

## 2022-05-31 MED ORDER — ROCURONIUM BROMIDE 10 MG/ML (PF) SYRINGE
PREFILLED_SYRINGE | INTRAVENOUS | Status: DC | PRN
  Administered 2022-05-31: 10 mg via INTRAVENOUS
  Administered 2022-05-31: 80 mg via INTRAVENOUS

## 2022-05-31 MED ORDER — SODIUM CHLORIDE 0.9 % IV SOLN
2.0000 g | INTRAVENOUS | Status: AC
  Administered 2022-05-31: 2 g via INTRAVENOUS
  Filled 2022-05-31: qty 2

## 2022-05-31 MED ORDER — LIDOCAINE HCL (PF) 2 % IJ SOLN
INTRAMUSCULAR | Status: AC
  Filled 2022-05-31: qty 5

## 2022-05-31 MED ORDER — ONDANSETRON HCL 4 MG/2ML IJ SOLN: 4.0000 mg | INTRAMUSCULAR | Status: DC | PRN

## 2022-05-31 MED ORDER — PHENYLEPHRINE 80 MCG/ML (10ML) SYRINGE FOR IV PUSH (FOR BLOOD PRESSURE SUPPORT)
PREFILLED_SYRINGE | INTRAVENOUS | Status: AC
  Filled 2022-05-31: qty 10

## 2022-05-31 MED ORDER — APREPITANT 40 MG PO CAPS
40.0000 mg | ORAL_CAPSULE | ORAL | Status: AC
  Administered 2022-05-31: 40 mg via ORAL
  Filled 2022-05-31: qty 1

## 2022-05-31 MED ORDER — HEPARIN SODIUM (PORCINE) 5000 UNIT/ML IJ SOLN
5000.0000 [IU] | INTRAMUSCULAR | Status: AC
  Administered 2022-05-31: 5000 [IU] via SUBCUTANEOUS
  Filled 2022-05-31: qty 1

## 2022-05-31 MED ORDER — STERILE WATER FOR IRRIGATION IR SOLN
Status: DC | PRN
  Administered 2022-05-31: 1000 mL

## 2022-05-31 MED ORDER — KETAMINE HCL 10 MG/ML IJ SOLN
INTRAMUSCULAR | Status: DC | PRN
  Administered 2022-05-31: 50 mg via INTRAVENOUS

## 2022-05-31 MED ORDER — PHENYLEPHRINE HCL (PRESSORS) 10 MG/ML IV SOLN
INTRAVENOUS | Status: AC
  Filled 2022-05-31: qty 1

## 2022-05-31 MED ORDER — DEXAMETHASONE SODIUM PHOSPHATE 10 MG/ML IJ SOLN
INTRAMUSCULAR | Status: DC | PRN
  Administered 2022-05-31: 10 mg via INTRAVENOUS

## 2022-05-31 MED ORDER — ONDANSETRON HCL 4 MG/2ML IJ SOLN
INTRAMUSCULAR | Status: DC | PRN
  Administered 2022-05-31: 4 mg via INTRAVENOUS

## 2022-05-31 MED ORDER — FENTANYL CITRATE (PF) 250 MCG/5ML IJ SOLN
INTRAMUSCULAR | Status: DC | PRN
  Administered 2022-05-31: 150 ug via INTRAVENOUS

## 2022-05-31 MED ORDER — MORPHINE SULFATE (PF) 2 MG/ML IV SOLN: 1.0000 mg | INTRAVENOUS | Status: DC | PRN

## 2022-05-31 MED ORDER — SIMETHICONE 80 MG PO CHEW: 80.0000 mg | CHEWABLE_TABLET | Freq: Four times a day (QID) | ORAL | Status: DC | PRN

## 2022-05-31 MED ORDER — DEXAMETHASONE SODIUM PHOSPHATE 10 MG/ML IJ SOLN
INTRAMUSCULAR | Status: AC
  Filled 2022-05-31: qty 1

## 2022-05-31 MED ORDER — BUPIVACAINE LIPOSOME 1.3 % IJ SUSP
INTRAMUSCULAR | Status: AC
  Filled 2022-05-31: qty 20

## 2022-05-31 MED ORDER — LIDOCAINE 2% (20 MG/ML) 5 ML SYRINGE
INTRAMUSCULAR | Status: DC | PRN
  Administered 2022-05-31: 1.5 mg/kg/h via INTRAVENOUS
  Administered 2022-05-31: 80 mg via INTRAVENOUS

## 2022-05-31 MED ORDER — MIDAZOLAM HCL 2 MG/2ML IJ SOLN
INTRAMUSCULAR | Status: AC
  Filled 2022-05-31: qty 2

## 2022-05-31 MED ORDER — HYDROMORPHONE HCL 1 MG/ML IJ SOLN
0.2500 mg | INTRAMUSCULAR | Status: DC | PRN
  Administered 2022-05-31 (×2): 0.5 mg via INTRAVENOUS

## 2022-05-31 MED ORDER — PROPOFOL 10 MG/ML IV BOLUS
INTRAVENOUS | Status: DC | PRN
  Administered 2022-05-31: 150 mg via INTRAVENOUS

## 2022-05-31 MED ORDER — 0.9 % SODIUM CHLORIDE (POUR BTL) OPTIME
TOPICAL | Status: DC | PRN
  Administered 2022-05-31: 1000 mL

## 2022-05-31 MED ORDER — LACTATED RINGERS IV SOLN: INTRAVENOUS | Status: DC

## 2022-05-31 MED ORDER — ENSURE MAX PROTEIN PO LIQD
2.0000 [oz_av] | ORAL | Status: DC
  Administered 2022-06-01 (×5): 2 [oz_av] via ORAL

## 2022-05-31 MED ORDER — ACETAMINOPHEN 500 MG PO TABS
1000.0000 mg | ORAL_TABLET | ORAL | Status: AC
  Administered 2022-05-31: 1000 mg via ORAL
  Filled 2022-05-31: qty 2

## 2022-05-31 MED ORDER — CHLORHEXIDINE GLUCONATE CLOTH 2 % EX PADS: 6.0000 | MEDICATED_PAD | Freq: Once | CUTANEOUS | Status: DC

## 2022-05-31 MED ORDER — SUGAMMADEX SODIUM 500 MG/5ML IV SOLN
INTRAVENOUS | Status: DC | PRN
  Administered 2022-05-31: 300 mg via INTRAVENOUS

## 2022-05-31 MED ORDER — FENTANYL CITRATE (PF) 250 MCG/5ML IJ SOLN
INTRAMUSCULAR | Status: AC
  Filled 2022-05-31: qty 5

## 2022-05-31 MED ORDER — KETAMINE HCL 50 MG/5ML IJ SOSY
PREFILLED_SYRINGE | INTRAMUSCULAR | Status: AC
  Filled 2022-05-31: qty 5

## 2022-05-31 MED ORDER — PANTOPRAZOLE SODIUM 40 MG IV SOLR
40.0000 mg | Freq: Every day | INTRAVENOUS | Status: DC
  Administered 2022-05-31: 40 mg via INTRAVENOUS
  Filled 2022-05-31: qty 10

## 2022-05-31 MED ORDER — KETOTIFEN FUMARATE 0.035 % OP SOLN
1.0000 [drp] | Freq: Two times a day (BID) | OPHTHALMIC | Status: DC
  Administered 2022-05-31 – 2022-06-01 (×2): 1 [drp] via OPHTHALMIC
  Filled 2022-05-31: qty 5

## 2022-05-31 MED ORDER — PHENYLEPHRINE 80 MCG/ML (10ML) SYRINGE FOR IV PUSH (FOR BLOOD PRESSURE SUPPORT)
PREFILLED_SYRINGE | INTRAVENOUS | Status: DC | PRN
  Administered 2022-05-31 (×5): 160 ug via INTRAVENOUS

## 2022-05-31 MED ORDER — OXYCODONE HCL 5 MG/5ML PO SOLN: 5.0000 mg | Freq: Once | ORAL | Status: AC | PRN

## 2022-05-31 MED ORDER — MIDAZOLAM HCL 2 MG/2ML IJ SOLN
INTRAMUSCULAR | Status: DC | PRN
  Administered 2022-05-31: 2 mg via INTRAVENOUS

## 2022-05-31 MED ORDER — OXYCODONE HCL 5 MG/5ML PO SOLN: 5.0000 mg | Freq: Four times a day (QID) | ORAL | Status: DC | PRN

## 2022-05-31 MED ORDER — ORAL CARE MOUTH RINSE: 15.0000 mL | Freq: Once | OROMUCOSAL | Status: AC

## 2022-05-31 MED ORDER — HYDROMORPHONE HCL 1 MG/ML IJ SOLN
INTRAMUSCULAR | Status: AC
  Filled 2022-05-31: qty 1

## 2022-05-31 MED ORDER — HEPARIN SODIUM (PORCINE) 5000 UNIT/ML IJ SOLN
5000.0000 [IU] | Freq: Three times a day (TID) | INTRAMUSCULAR | Status: DC
  Administered 2022-05-31 – 2022-06-01 (×3): 5000 [IU] via SUBCUTANEOUS
  Filled 2022-05-31 (×3): qty 1

## 2022-05-31 MED ORDER — BUPIVACAINE LIPOSOME 1.3 % IJ SUSP
INTRAMUSCULAR | Status: DC | PRN
  Administered 2022-05-31: 50 mL

## 2022-05-31 MED ORDER — GABAPENTIN 400 MG PO CAPS
600.0000 mg | ORAL_CAPSULE | Freq: Every day | ORAL | Status: DC
  Administered 2022-05-31: 600 mg via ORAL
  Filled 2022-05-31: qty 2

## 2022-05-31 MED ORDER — HYDROMORPHONE HCL 1 MG/ML IJ SOLN
1.0000 mg | Freq: Once | INTRAMUSCULAR | Status: AC
  Administered 2022-05-31: 1 mg via INTRAVENOUS

## 2022-05-31 MED ORDER — AMISULPRIDE (ANTIEMETIC) 5 MG/2ML IV SOLN: 10.0000 mg | Freq: Once | INTRAVENOUS | Status: DC | PRN

## 2022-05-31 MED ORDER — ONDANSETRON HCL 4 MG/2ML IJ SOLN
INTRAMUSCULAR | Status: AC
  Filled 2022-05-31: qty 2

## 2022-05-31 MED ORDER — OXYCODONE HCL 5 MG PO TABS: 5.0000 mg | ORAL_TABLET | Freq: Once | ORAL | Status: AC | PRN

## 2022-05-31 SURGICAL SUPPLY — 78 items
ADH SKN CLS APL DERMABOND .7 (GAUZE/BANDAGES/DRESSINGS) ×2
ANTIFOG SOL W/FOAM PAD STRL (MISCELLANEOUS) ×2
APL PRP STRL LF DISP 70% ISPRP (MISCELLANEOUS) ×4
APPLIER CLIP 5 13 M/L LIGAMAX5 (MISCELLANEOUS)
APPLIER CLIP ROT 10 11.4 M/L (STAPLE)
APR CLP MED LRG 11.4X10 (STAPLE)
APR CLP MED LRG 5 ANG JAW (MISCELLANEOUS)
BAG COUNTER SPONGE SURGICOUNT (BAG) ×2 IMPLANT
BAG SPNG CNTER NS LX DISP (BAG) ×2
BLADE SURG SZ11 CARB STEEL (BLADE) ×2 IMPLANT
CANNULA REDUC XI 12-8 STAPL (CANNULA) ×2
CANNULA REDUCER 12-8 DVNC XI (CANNULA) ×2 IMPLANT
CHLORAPREP W/TINT 26 (MISCELLANEOUS) ×4 IMPLANT
CLIP APPLIE 5 13 M/L LIGAMAX5 (MISCELLANEOUS) IMPLANT
CLIP APPLIE ROT 10 11.4 M/L (STAPLE) IMPLANT
COVER SURGICAL LIGHT HANDLE (MISCELLANEOUS) ×2 IMPLANT
COVER TIP SHEARS 8 DVNC (MISCELLANEOUS) IMPLANT
COVER TIP SHEARS 8MM DA VINCI (MISCELLANEOUS)
DERMABOND ADVANCED .7 DNX12 (GAUZE/BANDAGES/DRESSINGS) ×2 IMPLANT
DRAPE ARM DVNC X/XI (DISPOSABLE) ×8 IMPLANT
DRAPE COLUMN DVNC XI (DISPOSABLE) ×2 IMPLANT
DRAPE DA VINCI XI ARM (DISPOSABLE) ×8
DRAPE DA VINCI XI COLUMN (DISPOSABLE) ×2
ELECT REM PT RETURN 15FT ADLT (MISCELLANEOUS) ×2 IMPLANT
GAUZE 4X4 16PLY ~~LOC~~+RFID DBL (SPONGE) ×2 IMPLANT
GLOVE BIO SURGEON STRL SZ7.5 (GLOVE) ×4 IMPLANT
GLOVE INDICATOR 8.0 STRL GRN (GLOVE) ×4 IMPLANT
GOWN STRL REUS W/ TWL XL LVL3 (GOWN DISPOSABLE) ×4 IMPLANT
GOWN STRL REUS W/TWL XL LVL3 (GOWN DISPOSABLE) ×4
GRASPER SUT TROCAR 14GX15 (MISCELLANEOUS) ×2 IMPLANT
HEMOSTAT SNOW SURGICEL 2X4 (HEMOSTASIS) IMPLANT
IRRIG SUCT STRYKERFLOW 2 WTIP (MISCELLANEOUS) ×2
IRRIGATION SUCT STRKRFLW 2 WTP (MISCELLANEOUS) ×2 IMPLANT
IRRIGATOR SUCT 8 DISP DVNC XI (IRRIGATION / IRRIGATOR) IMPLANT
IRRIGATOR SUCTION 8MM XI DISP (IRRIGATION / IRRIGATOR)
KIT BASIN OR (CUSTOM PROCEDURE TRAY) ×2 IMPLANT
KIT TURNOVER KIT A (KITS) IMPLANT
LUBRICANT JELLY K Y 4OZ (MISCELLANEOUS) IMPLANT
MARKER SKIN DUAL TIP RULER LAB (MISCELLANEOUS) IMPLANT
MAT PREVALON FULL STRYKER (MISCELLANEOUS) ×2 IMPLANT
NDL SPNL 18GX3.5 QUINCKE PK (NEEDLE) ×2 IMPLANT
NEEDLE SPNL 18GX3.5 QUINCKE PK (NEEDLE) ×2 IMPLANT
OBTURATOR OPTICAL STANDARD 8MM (TROCAR) ×2
OBTURATOR OPTICAL STND 8 DVNC (TROCAR) ×2
OBTURATOR OPTICALSTD 8 DVNC (TROCAR) ×2 IMPLANT
PACK CARDIOVASCULAR III (CUSTOM PROCEDURE TRAY) ×2 IMPLANT
RELOAD STAPLE 60 2.5 WHT DVNC (STAPLE) IMPLANT
RELOAD STAPLE 60 3.5 BLU DVNC (STAPLE) IMPLANT
RELOAD STAPLER 2.5X60 WHT DVNC (STAPLE) ×10 IMPLANT
RELOAD STAPLER 3.5X60 BLU DVNC (STAPLE) ×2 IMPLANT
SCISSORS LAP 5X35 DISP (ENDOMECHANICALS) IMPLANT
SEAL UNIV 5-12 XI (MISCELLANEOUS) ×8 IMPLANT
SEAL XI UNIVERSAL 5-12 (MISCELLANEOUS) ×8
SEALER VESSEL DA VINCI XI (MISCELLANEOUS) ×2
SEALER VESSEL EXT DVNC XI (MISCELLANEOUS) ×2 IMPLANT
SET TUBE SMOKE EVAC HIGH FLOW (TUBING) ×2 IMPLANT
SLEEVE GASTRECTOMY 40FR VISIGI (MISCELLANEOUS) IMPLANT
SOL ELECTROSURG ANTI STICK (MISCELLANEOUS) ×2
SOLUTION ANTFG W/FOAM PAD STRL (MISCELLANEOUS) ×2 IMPLANT
SOLUTION ELECTROSURG ANTI STCK (MISCELLANEOUS) ×2 IMPLANT
SPIKE FLUID TRANSFER (MISCELLANEOUS) ×2 IMPLANT
STAPLER 60 DA VINCI SURE FORM (STAPLE) ×2
STAPLER 60 SUREFORM DVNC (STAPLE) ×2 IMPLANT
STAPLER RELOAD 2.5X60 WHITE (STAPLE) ×10
STAPLER RELOAD 2.5X60 WHT DVNC (STAPLE) ×10
STAPLER RELOAD 3.5X60 BLU DVNC (STAPLE) ×2
STAPLER RELOAD 3.5X60 BLUE (STAPLE) ×2
SUT MNCRL AB 4-0 PS2 18 (SUTURE) ×4 IMPLANT
SUT VIC AB 0 CT1 27 (SUTURE) ×2
SUT VIC AB 0 CT1 27XBRD ANTBC (SUTURE) ×2 IMPLANT
SUT VIC AB 2-0 SH 27 (SUTURE) ×2
SUT VIC AB 2-0 SH 27XBRD (SUTURE) ×2 IMPLANT
SYR 20ML LL LF (SYRINGE) ×2 IMPLANT
TOWEL OR 17X26 10 PK STRL BLUE (TOWEL DISPOSABLE) ×2 IMPLANT
TRAY FOLEY MTR SLVR 16FR STAT (SET/KITS/TRAYS/PACK) IMPLANT
TROCAR ADV FIXATION 12X100MM (TROCAR) IMPLANT
TROCAR Z-THREAD FIOS 5X100MM (TROCAR) ×2 IMPLANT
TUBE CALIBRATION LAPBAND (TUBING) IMPLANT

## 2022-05-31 NOTE — Progress Notes (Signed)
PHARMACY CONSULT FOR:  Risk Assessment for Post-Discharge VTE Following Bariatric Surgery  Post-Discharge VTE Risk Assessment: This patient's probability of 30-day post-discharge VTE is increased due to the factors marked: X Sleeve gastrectomy   Liver disorder (transplant, cirrhosis, or nonalcoholic steatohepatitis)   Hx of VTE   Hemorrhage requiring transfusion   GI perforation, leak, or obstruction   ====================================================    Female  X  Age >/=60 years    BMI >/=50 kg/m2    CHF    Dyspnea at Rest    Paraplegia  X  Non-gastric-band surgery    Operation Time >/=3 hr    Return to OR     Length of Stay >/= 3 d   Hypercoagulable condition   Significant venous stasis      Predicted probability of 30-day post-discharge VTE: 0.31%   Recommendation for Discharge: No pharmacologic prophylaxis post-discharge    Melinda Hayes is a 67 y.o. female who underwent  sleeve gastrectomy on 05/31/22.   Case start: 0752 Case end: 0909   Allergies  Allergen Reactions   Statins Other (See Comments)    cramps    Patient Measurements: Height: 5\' 7"  (170.2 cm) Weight: 127.2 kg (280 lb 6.4 oz) IBW/kg (Calculated) : 61.6 Body mass index is 43.92 kg/m.  Recent Labs    05/31/22 1006  WBC 10.1  HGB 11.7*  HCT 37.4  PLT 276  CREATININE 0.99   Estimated Creatinine Clearance: 77.5 mL/min (by C-G formula based on SCr of 0.99 mg/dL).    Past Medical History:  Diagnosis Date   Anemia    Family history of adverse reaction to anesthesia    Brother N&V   Hypertension    Pneumonia    Rheumatoid arthritis 2019     Medications Prior to Admission  Medication Sig Dispense Refill Last Dose   acetaminophen (TYLENOL) 500 MG tablet Take 1,000 mg by mouth every 6 (six) hours as needed for moderate pain.   05/30/2022   amLODipine (NORVASC) 10 MG tablet Take 10 mg by mouth in the morning.   05/31/2022 at 0400   ezetimibe (ZETIA) 10 MG tablet Take 5 mg by mouth in  the morning.   05/31/2022 at 0400   ferrous sulfate 325 (65 FE) MG EC tablet Take 325 mg by mouth every Monday, Wednesday, and Friday. In the morning   05/28/2022   gabapentin (NEURONTIN) 300 MG capsule Take 600 mg by mouth at bedtime.   Past Week   ketotifen (ZADITOR) 0.035 % ophthalmic solution Place 1 drop into the left eye in the morning and at bedtime.   05/30/2022   meloxicam (MOBIC) 15 MG tablet Take 15 mg by mouth daily in the afternoon.   Past Week   Multiple Vitamin (MULTIVITAMIN WITH MINERALS) TABS tablet Take 1 tablet by mouth in the morning.   Past Week   triamterene-hydrochlorothiazide (MAXZIDE-25) 37.5-25 MG tablet Take 0.5 tablets by mouth in the morning.   05/30/2022   Vitamin D, Ergocalciferol, (DRISDOL) 50000 units CAPS capsule Take 50,000 Units by mouth every Monday.          Lynelle Doctor 05/31/2022,11:06 AM

## 2022-05-31 NOTE — H&P (Signed)
Admitting Physician: Nickola Major Ngoc Detjen  Service: Bariatric Surgery  CC: Obesity  Subjective   HPI: Melinda Hayes is an 67 y.o. female who is here for sleeve gastrectomy  Past Medical History:  Diagnosis Date   Anemia    Family history of adverse reaction to anesthesia    Brother N&V   Hypertension    Pneumonia    Rheumatoid arthritis 2019    Past Surgical History:  Procedure Laterality Date   ABDOMINAL HYSTERECTOMY     BIOPSY  04/13/2022   Procedure: BIOPSY;  Surgeon: Felicie Morn, MD;  Location: [facility];  Service: General;;   ESOPHAGOGASTRODUODENOSCOPY N/A 04/13/2022   Procedure: ESOPHAGOGASTRODUODENOSCOPY (EGD);  Surgeon: Felicie Morn, MD;  Location: [facility];  Service: General;  Laterality: N/A;   NECK SURGERY     TOTAL KNEE ARTHROPLASTY Right 2016   TRIGGER FINGER RELEASE Right     History reviewed. No pertinent family history.  Social:  reports that she has never smoked. She has never used smokeless tobacco. She reports that she does not currently use alcohol. She reports that she does not currently use drugs.  Allergies:  Allergies  Allergen Reactions   Statins Other (See Comments)    cramps    Medications: Current Outpatient Medications  Medication Instructions   acetaminophen (TYLENOL) 1,000 mg, Oral, Every 6 hours PRN   amLODipine (NORVASC) 10 mg, Oral, Every morning   ezetimibe (ZETIA) 5 mg, Oral, Every morning   ferrous sulfate 325 mg, Oral, Every M-W-F, In the morning   gabapentin (NEURONTIN) 600 mg, Oral, Daily at bedtime   ketotifen (ZADITOR) 0.035 % ophthalmic solution 1 drop, Left Eye, 2 times daily   meloxicam (MOBIC) 15 mg, Oral, Daily   Multiple Vitamin (MULTIVITAMIN WITH MINERALS) TABS tablet 1 tablet, Oral, Every morning   triamterene-hydrochlorothiazide (MAXZIDE-25) 37.5-25 MG tablet 0.5 tablets, Oral, Every morning   Vitamin D (Ergocalciferol) (DRISDOL) 50,000 Units, Oral, Every Mon    ROS - all of the  below systems have been reviewed with the patient and positives are indicated with bold text General: chills, fever or night sweats Eyes: blurry vision or double vision ENT: epistaxis or sore throat Allergy/Immunology: itchy/watery eyes or nasal congestion Hematologic/Lymphatic: bleeding problems, blood clots or swollen lymph nodes Endocrine: temperature intolerance or unexpected weight changes Breast: new or changing breast lumps or nipple discharge Resp: cough, shortness of breath, or wheezing CV: chest pain or dyspnea on exertion GI: as per HPI GU: dysuria, trouble voiding, or hematuria MSK: joint pain or joint stiffness Neuro: TIA or stroke symptoms Derm: pruritus and skin lesion changes Psych: anxiety and depression  Objective   PE Blood pressure (!) 146/74, pulse 85, temperature 98.3 F (36.8 C), temperature source Oral, resp. rate 16, height 5\' 7"  (1.702 m), weight 127.2 kg, SpO2 98 %. Constitutional: NAD; conversant; no deformities Eyes: Moist conjunctiva; no lid lag; anicteric; PERRL Neck: Trachea midline; no thyromegaly Lungs: Normal respiratory effort; no tactile fremitus CV: RRR; no palpable thrills; no pitting edema GI: Abd soft, nontender; no palpable hepatosplenomegaly MSK: Normal range of motion of extremities; no clubbing/cyanosis Psychiatric: Appropriate affect; alert and oriented x3 Lymphatic: No palpable cervical or axillary lymphadenopathy  No results found for this or any previous visit (from the past 24 hour(s)).  Imaging Orders  No imaging studies ordered today   Upper endoscopy was normal with no hiatal hernia or signs of acid reflux   Assessment and Plan   We discussed the surgical options to treat obesity  and its associated comorbidity. After discussing the available procedures in the region, we discussed in great detail the surgeries I offer: robotic sleeve gastrectomy and robotic roux-en-y gastric bypass. We discussed the procedures themselves as  well as their risks, benefits and alternatives. I entered the patient's basic information into the [facility] Metabolic Surgery Risk/Benefit Calculator to facilitate this discussion.  After a full discussion and all questions answered, the patient is interested in pursuing a robotic sleeve gastrectomy with upper endoscopy  She completed the bariatric surgery preoperative pathway to include the following: - Bloodwork - Dietician consult - Chest x-ray - EKG - Psychology evaluation - Physical therapy - Upper endoscopy with biopsy.  Today she presents for surgery. We again discussed robotic sleeve gastrectomy with upper endoscopy. We discussed the procedure itself as well as its risk, benefits, and alternatives. After full discussion all questions answered the patient again granted consent to proceed. We will proceed as scheduled.       ICD-10-CM   1. Hypertension, unspecified type  I10 CANCELED: EKG 12 lead per protocol    2. Pre-op testing  Z01.818 CANCELED: DG Chest 2 View per protocol    3. Preoperative testing  Z01.818 Pregnancy, urine STAT morning of surgery    Pregnancy, urine STAT morning of surgery    CANCELED: Pregnancy, urine URGENT morning of surgery    CANCELED: Pregnancy, urine URGENT morning of surgery       Felicie Morn, MD  [facility] Use [email or web address] to contact on call provider

## 2022-05-31 NOTE — Transfer of Care (Signed)
Immediate Anesthesia Transfer of Care Note  Patient: Melinda Hayes  Procedure(s) Performed: XI ROBOTIC SLEEVE GASTRECTOMY (Abdomen) UPPER GI ENDOSCOPY  Patient Location: PACU  Anesthesia Type:General  Level of Consciousness: awake  Airway & Oxygen Therapy: Patient Spontanous Breathing and Patient connected to face mask oxygen  Post-op Assessment: Report given to RN and Post -op Vital signs reviewed and stable  Post vital signs: Reviewed and stable  Last Vitals:  Vitals Value Taken Time  BP    Temp    Pulse 72 05/31/22 0919  Resp 15 05/31/22 0919  SpO2 100 % 05/31/22 0919  Vitals shown include unvalidated device data.  Last Pain:  Vitals:   05/31/22 0624  TempSrc: Oral         Complications: No notable events documented.

## 2022-05-31 NOTE — Anesthesia Postprocedure Evaluation (Signed)
Anesthesia Post Note  Patient: Melinda Hayes  Procedure(s) Performed: XI ROBOTIC SLEEVE GASTRECTOMY (Abdomen) UPPER GI ENDOSCOPY     Patient location during evaluation: PACU Anesthesia Type: General Level of consciousness: awake and alert and oriented Pain management: pain level controlled Vital Signs Assessment: post-procedure vital signs reviewed and stable Respiratory status: spontaneous breathing, nonlabored ventilation and respiratory function stable Cardiovascular status: blood pressure returned to baseline and stable Postop Assessment: no apparent nausea or vomiting Anesthetic complications: no   No notable events documented.  Last Vitals:  Vitals:   05/31/22 1030 05/31/22 1045  BP: (!) 169/90 (!) 161/89  Pulse: 79 84  Resp: 18 15  Temp:    SpO2: 96% 96%    Last Pain:  Vitals:   05/31/22 1045  TempSrc:   PainSc: Asleep                 Matyas Baisley A.

## 2022-05-31 NOTE — Op Note (Signed)
Patient: Melinda Hayes (1955/04/01, [id number])  Date of Surgery: 05/31/2022   Preoperative Diagnosis: MORBID OBESITY   Postoperative Diagnosis: MORBID OBESITY   Surgical Procedure: XI ROBOTIC SLEEVE GASTRECTOMY: [id number] (CPT) UPPER GI ENDOSCOPY: [id number]   Operative Team Members:  Surgeon(s) and Role:    * Nikia Levels, Nickola Major, MD - Primary   Anesthesiologist: Josephine Igo, MD CRNA: [place] Stephanie C, CRNA; Sharlette Dense, CRNA   Anesthesia: General   Fluids:  Total I/O In: -  Out: 25 0000000  Complications: None  Drains:  none   Specimen:  ID Type Source Tests Collected by Time Destination  1 : greater curviture of stomach Tissue PATH GI benign resection SURGICAL PATHOLOGY Marketa Midkiff, Nickola Major, MD 05/31/2022 0910      Disposition:  PACU - hemodynamically stable.  Plan of Care: Admit for overnight observation    Indications for Procedure: Melinda Hayes is a 67 y.o. female who presented with obesity.  Here for sleeve gastrectomy.  We discussed the surgical options to treat obesity and its associated comorbidity. After discussing the available procedures in the region, we discussed in great detail the surgeries I offer: robotic sleeve gastrectomy and robotic roux-en-y gastric bypass. We discussed the procedures themselves as well as their risks, benefits and alternatives. I entered the patient's basic information into the [facility] Metabolic Surgery Risk/Benefit Calculator to facilitate this discussion.  After a full discussion and all questions answered, the patient is interested in pursuing a robotic sleeve gastrectomy with upper endoscopy  She completed the bariatric surgery preoperative pathway to include the following: - Bloodwork - Dietician consult - Chest x-ray - EKG - Psychology evaluation - Physical therapy - Upper endoscopy with biopsy.  Today she presents for surgery. We again discussed robotic sleeve gastrectomy with upper endoscopy. We  discussed the procedure itself as well as its risk, benefits, and alternatives. After full discussion all questions answered the patient again granted consent to proceed. We will proceed as scheduled.  Findings: Normal anatomy  Infection status: Patient: Private Patient Elective Case Case: Elective Infection Present At Time Of Surgery (PATOS): None   Description of Procedure:   On the date stated above, the patient was taken to the operating room suite and placed in supine positioning.  General endotracheal anesthesia was induced.  A timeout was completed verifying the correct patient, procedure, positioning and equipment needed for the case.  The patient's abdomen was prepped and draped in the usual sterile fashion.  I entered the patient's right upper quadrant using a 5 mm trocar in the optical technique.  There was no trauma to underlying viscera with initial trocar placement.  The abdomen was insufflated 15 mmHg.  A total of 4 robotic trochars were placed across the mid abdomen, including the 5 mm initial trocar being upsized to a 8 mm trocar.  The robotic stapler trocar was placed in the number two position.  The [facility] liver retractor was placed through the subxiphoid region and under the left lobe of the liver and was connected to the rail of the bed.  A TAP block was placed using marcaine and Exparel under direct vision of the laparoscope.  The [place] Arc was docked and we transitioned to robotic surgery.   Using the tip up fenestrated grasper, fenestrated bipolar, 30 degree camera and Vessel Sealer from the patient's right to left, we began by dissecting the angle of His off the left crus of the diaphragm.  The adhesions between the stomach, spleen and diaphragm  were divided using the Vessel Sealer to define the angle of His.    I then started 6 cm away from the pylorus along the greater curve the stomach and divided the gastroepiploic vessels and the gastrocolic  ligament.  The lesser sac was entered.  There were really no adhesions to the posterior wall of the stomach.  The greater curve was mobilized working superiorly toward the spleen.  All of the gastroepiploic and short gastric vessels were divided as we divided the gastrocolic and gastrosplenic ligaments.  As we reached the splenic hilum, I lifted the stomach anteriorly.  I created a tunnel between the stomach and its attachments to the retroperitoneum posteriorly just to the left of the GE junction until I encountered the left crus and my previous angle of His dissection.  We then were able to approach the shortest of the short gastrics both from the greater curve the stomach laterally and from the left crus medially.  These were divided using the Vessel Sealer and the fundus of the stomach was fully mobilized.  With the stomach fully mobilized we direct our attention to stapling.  A 40 French VISI G was inserted into the stomach and positioned along the lesser curve the stomach and suction was applied.  The 60 mm robotic sureform linear stapler was used to create the sleeve gastrectomy.  We started 6 cm from the pylorus and were careful to avoid narrowing at the incisura.  We stayed about 1 cm away from the GE junction to protect the sling fibers.  We used one blue and multiple white loads of the linear stapler.  With the sleeve gastrectomy completed the VISI G was taken off suction and removed and we performed an upper endoscopy.  The intra-abdominal pressure was decreased to 3mmHg to check hemostasis.  The foregut was submerged in saline irrigation and the adult upper endoscope was inserted into the stomach as far as the pylorus to inspect the sleeve.  The sleeve appeared appropriately oriented without any twisting.  There was good hemostasis.  The sleeve was widely patent at the incisura with no narrowing.  There was no significant retained fundus.  The sleeve was inflated with the endoscope and there was no  bubbling of the irrigation, suggesting a negative leak test and a airtight sleeve gastrectomy.  The foregut was decompressed with the endoscope and the endoscope was removed.  An omentopexy was performed, using running 2-0 vicryl suture to connect the inferior two staple lines to the divided omentum. There was good hemostasis at the end of the case.  The robot was undocked and moved away from the field.  The sleeve gastrectomy specimen was removed from the stapler port.  The fascia of the stapler port was closed using a 0 Vicryl on a PMI suture passer.   The liver retractor was removed under direct vision.  The pneumoperitoneum was evacuated.  The skin was closed using 4-0 Monocryl and Dermabond.  All sponge and needle counts were correct at the end of the case.    Louanna Raw, MD General, Bariatric, & Minimally Invasive Surgery [facility], [place]

## 2022-05-31 NOTE — Plan of Care (Signed)
  Problem: Activity: Goal: Ability to tolerate increased activity will improve Outcome: Progressing   Problem: Bowel/Gastric: Goal: Gastrointestinal status for postoperative course will improve Outcome: Progressing Goal: Occurrences of nausea will decrease Outcome: Progressing   Problem: Nutritional: Goal: Nutritional status will improve Outcome: Progressing

## 2022-05-31 NOTE — Anesthesia Procedure Notes (Signed)
Procedure Name: Intubation Date/Time: 05/31/2022 7:34 AM  Performed by: Sharlette Dense, CRNAPre-anesthesia Checklist: Patient identified, Emergency Drugs available, Suction available and Patient being monitored Patient Re-evaluated:Patient Re-evaluated prior to induction Oxygen Delivery Method: Circle system utilized Preoxygenation: Pre-oxygenation with 100% oxygen Induction Type: IV induction Ventilation: Mask ventilation without difficulty and Oral airway inserted - appropriate to patient size Laryngoscope Size: Sabra Heck and 2 Grade View: Grade I Tube type: Oral Tube size: 7.5 mm Number of attempts: 1 Airway Equipment and Method: Stylet and Oral airway Placement Confirmation: ETT inserted through vocal cords under direct vision, positive ETCO2 and breath sounds checked- equal and bilateral Secured at: 22 cm Tube secured with: Tape Dental Injury: Teeth and Oropharynx as per pre-operative assessment

## 2022-05-31 NOTE — Progress Notes (Signed)
Discussed QI "Goals for Discharge" document with patient including ambulation in halls, Incentive Spirometry use every hour, and oral care.  Also discussed pain and nausea control.  Enabled or verified head of bed 30 degree alarm activated.  BSTOP education provided including BSTOP information guide, "Guide for Pain Management after your Bariatric Procedure".  Diet progression education provided including "Bariatric Surgery Post-Op Food Plan Phase 1: Liquids".  Questions answered.  Will continue to partner with bedside RN and follow up with patient per protocol.    Thank you,  Eleuterio Dollar Hailie Searight, RN, MSN Bariatric Nurse Coordinator [phone] (office)  

## 2022-06-01 ENCOUNTER — Other Ambulatory Visit (HOSPITAL_COMMUNITY): Payer: Self-pay

## 2022-06-01 ENCOUNTER — Encounter (HOSPITAL_COMMUNITY): Payer: Self-pay | Admitting: Surgery

## 2022-06-01 LAB — CBC WITH DIFFERENTIAL/PLATELET
Abs Immature Granulocytes: 0.07 10*3/uL (ref 0.00–0.07)
Basophils Absolute: 0 10*3/uL (ref 0.0–0.1)
Basophils Relative: 0 %
Eosinophils Absolute: 0 10*3/uL (ref 0.0–0.5)
Eosinophils Relative: 0 %
HCT: 41.1 % (ref 36.0–46.0)
Hemoglobin: 12.8 g/dL (ref 12.0–15.0)
Immature Granulocytes: 1 %
Lymphocytes Relative: 18 %
Lymphs Abs: 2 10*3/uL (ref 0.7–4.0)
MCH: 25.6 pg — ABNORMAL LOW (ref 26.0–34.0)
MCHC: 31.1 g/dL (ref 30.0–36.0)
MCV: 82.2 fL (ref 80.0–100.0)
Monocytes Absolute: 0.5 10*3/uL (ref 0.1–1.0)
Monocytes Relative: 4 %
Neutro Abs: 8.9 10*3/uL — ABNORMAL HIGH (ref 1.7–7.7)
Neutrophils Relative %: 77 %
Platelets: 328 10*3/uL (ref 150–400)
RBC: 5 MIL/uL (ref 3.87–5.11)
RDW: 15.9 % — ABNORMAL HIGH (ref 11.5–15.5)
WBC: 11.5 10*3/uL — ABNORMAL HIGH (ref 4.0–10.5)
nRBC: 0 % (ref 0.0–0.2)

## 2022-06-01 MED ORDER — ACETAMINOPHEN 500 MG PO TABS
1000.0000 mg | ORAL_TABLET | Freq: Three times a day (TID) | ORAL | 0 refills | Status: AC
Start: 2022-06-01 — End: 2022-06-06

## 2022-06-01 MED ORDER — PANTOPRAZOLE SODIUM 40 MG PO TBEC
40.0000 mg | DELAYED_RELEASE_TABLET | Freq: Every day | ORAL | 0 refills | Status: AC
Start: 2022-06-01 — End: ?
  Filled 2022-06-01: qty 90, 90d supply, fill #0

## 2022-06-01 MED ORDER — METHOCARBAMOL 750 MG PO TABS
750.0000 mg | ORAL_TABLET | Freq: Four times a day (QID) | ORAL | 0 refills | Status: AC
  Filled 2022-06-01: qty 30, 8d supply, fill #0

## 2022-06-01 MED ORDER — OXYCODONE HCL 5 MG PO TABS
5.0000 mg | ORAL_TABLET | Freq: Four times a day (QID) | ORAL | 0 refills | Status: AC | PRN
Start: 2022-06-01 — End: ?
  Filled 2022-06-01: qty 10, 3d supply, fill #0

## 2022-06-01 MED ORDER — ONDANSETRON 4 MG PO TBDP
4.0000 mg | ORAL_TABLET | Freq: Four times a day (QID) | ORAL | 0 refills | Status: AC | PRN
  Filled 2022-06-01: qty 18, 21d supply, fill #0

## 2022-06-01 NOTE — Progress Notes (Signed)
Discharge package printed and instructions given to patient. Verbalizes understanding.  

## 2022-06-01 NOTE — TOC CM/SW Note (Signed)
  Transition of Care [facility]) Screening Note   Patient Details  Name: Melinda Hayes Date of Birth: 05-01-1955   Transition of Care [facility]) CM/SW Contact:    Lennart Pall, LCSW Phone Number: 06/01/2022, 1:56 PM    Transition of Care Department [facility]) has reviewed patient and no TOC needs have been identified at this time. We will continue to monitor patient advancement through interdisciplinary progression rounds. If new patient transition needs arise, please place a TOC consult.

## 2022-06-01 NOTE — Progress Notes (Signed)
Patient alert and oriented, pain is controlled. Patient is tolerating fluids, advanced to protein shake today, patient is tolerating well. Reviewed Gastric sleeve/ bypass discharge instructions with patient and patient is able to articulate understanding. Provided information on BELT program, Support Group and [facility] outpatient pharmacy. Communicated general update of patient status to surgeon. All questions answered. 24hr fluid recall is 510 mL  per hydration protocol, bariatric nurse coordinator to make follow-up phone call within one week.

## 2022-06-01 NOTE — Plan of Care (Signed)
  Problem: Activity: Goal: Ability to tolerate increased activity will improve Outcome: Progressing   Problem: Bowel/Gastric: Goal: Gastrointestinal status for postoperative course will improve Outcome: Progressing Goal: Occurrences of nausea will decrease Outcome: Progressing   Problem: Nutritional: Goal: Nutritional status will improve Outcome: Progressing

## 2022-06-01 NOTE — Discharge Instructions (Signed)

## 2022-06-01 NOTE — Discharge Summary (Signed)
Patient ID: Melinda Hayes [id number] y.o. Mar 17, 1955  05/31/2022  Discharge date and time: 06/01/2022  Admitting Physician: [place]  Discharge Physician: [place]  Admission Diagnoses: Morbid obesity with BMI of 40.0-44.9, adult [E66.01, Z68.41] Patient Active Problem List   Diagnosis Date Noted   Morbid obesity with BMI of 40.0-44.9, adult 05/31/2022   Cervical radiculopathy 10/25/2017   Chronic midline low back pain without sciatica 10/25/2017     Discharge Diagnoses:  Patient Active Problem List   Diagnosis Date Noted   Morbid obesity with BMI of 40.0-44.9, adult 05/31/2022   Cervical radiculopathy 10/25/2017   Chronic midline low back pain without sciatica 10/25/2017    Operations: Procedure(s): XI ROBOTIC SLEEVE GASTRECTOMY UPPER GI ENDOSCOPY  Admission Condition: good  Discharged Condition: good  Indication for Admission: Obesity  Hospital Course: Ms. Baldassarre presented for robotic sleeve gastrectomy.  She recovered well and was discharged the following day.  Consults: None  Significant Diagnostic Studies: None  Treatments: surgery: As above  Disposition: Home  Patient Instructions:  Allergies as of 06/01/2022       Reactions   Statins Other (See Comments)   cramps        Medication List     STOP taking these medications    meloxicam 15 MG tablet Commonly known as: MOBIC   triamterene-hydrochlorothiazide 37.5-25 MG tablet Commonly known as: MAXZIDE-25       TAKE these medications    acetaminophen 500 MG tablet Commonly known as: TYLENOL Take 1,000 mg by mouth every 6 (six) hours as needed for moderate pain. What changed: Another medication with the same name was added. Make sure you understand how and when to take each.   acetaminophen 500 MG tablet Commonly known as: TYLENOL Take 2 tablets (1,000 mg total) by mouth every 8 (eight) hours for 5 days. What changed: You were already taking a medication with the  same name, and this prescription was added. Make sure you understand how and when to take each.   amLODipine 10 MG tablet Commonly known as: NORVASC Take 10 mg by mouth in the morning.   ezetimibe 10 MG tablet Commonly known as: ZETIA Take 5 mg by mouth in the morning.   ferrous sulfate 325 (65 FE) MG EC tablet Take 325 mg by mouth every Monday, Wednesday, and Friday. In the morning   gabapentin 300 MG capsule Commonly known as: NEURONTIN Take 600 mg by mouth at bedtime.   ketotifen 0.035 % ophthalmic solution Commonly known as: ZADITOR Place 1 drop into the left eye in the morning and at bedtime.   methocarbamol 750 MG tablet Commonly known as: Robaxin-750 Take 1 tablet (750 mg total) by mouth 4 (four) times daily.   multivitamin with minerals Tabs tablet Take 1 tablet by mouth in the morning.   ondansetron 4 MG disintegrating tablet Commonly known as: ZOFRAN-ODT Take 1 tablet (4 mg total) by mouth every 6 (six) hours as needed for nausea or vomiting.   oxyCODONE 5 MG immediate release tablet Commonly known as: Oxy IR/ROXICODONE Take 1 tablet (5 mg total) by mouth every 6 (six) hours as needed for severe pain.   pantoprazole 40 MG tablet Commonly known as: PROTONIX Take 1 tablet (40 mg total) by mouth daily.   Vitamin D (Ergocalciferol) 1.25 MG (50000 UNIT) Caps capsule Commonly known as: DRISDOL Take 50,000 Units by mouth every Monday.        Activity: no heavy lifting for 4 weeks Diet:  Bariatric diet  protocol Wound Care: keep wound clean and dry  Follow-up:  With Dr. Thermon Leyland  Signed: Nickola Major [facility], [place]   06/01/2022, 1:28 PM

## 2022-06-08 ENCOUNTER — Telehealth (HOSPITAL_COMMUNITY): Payer: Self-pay | Admitting: *Deleted

## 2022-06-08 NOTE — Telephone Encounter (Signed)
Voicemail left, will attempt to call back later

## 2022-06-09 ENCOUNTER — Telehealth (HOSPITAL_COMMUNITY): Payer: Self-pay | Admitting: *Deleted

## 2022-06-09 NOTE — Telephone Encounter (Signed)
1. Tell me about your pain and pain management?    Pt denies any pain.   2. Let's talk about fluid intake. How much total fluid are you taking in?   Pt states that she is meeting her goal of 64 oz of fluid today. Pt encouraged to continue to work towards meeting goal. Pt instructed to assess status and suggestions daily utilizing Hydration Action Plan on discharge folder and to call [facility] if in the "red zone".   3. How much protein have you taken in the last day?    Pt states she is meeting the goal of 60g of protein each day with the protein shakes and water.   4. Have you had nausea? Tell me about when you have experienced nausea and what you did to help?   Pt denies nausea.   5. Has the frequency or color changed with your urine?   Pt states that s/he is urinating "fine" with no changes in frequency or urgency.   6. Tell me what your incisions look like?   "Incisions look fine". Pt denies a fever, chills. Pt states incisions are not swollen, open, or draining. Pt encouraged to call [facility] if incisions change.   7. Have you been passing gas? BM?   Pt states that they have had a BM on today, pt did not take any laxatives to have one. Pt instructed to take either Miralax or MoM as instructed per "Gastric Bypass/Sleeve Discharge Home Care Instructions". Pt to call surgeon's office if not able to have BM with medication.   8. If a problem or question were to arise who would you call? Do you know contact numbers for [facility], [facility], and [facility]?   Pt knows to call [facility] for surgical, [facility] for nutrition, and [facility] for non-urgent questions or concerns. Pt denies dehydration symptoms. Pt can describe s/sx of dehydration.   9. How has the walking going?   Pt states s/he is walking around and able to be active without difficulty.   10. Are you still using your incentive spirometer? If so, how often?   Pt states that s/he is doing the I.S. Pt encouraged to use incentive spirometer, at least 10x every hour  while awake until s/he sees the surgeon.

## 2022-06-15 ENCOUNTER — Encounter: Payer: Self-pay | Admitting: Dietician

## 2022-06-15 ENCOUNTER — Encounter: Payer: No Typology Code available for payment source | Attending: Surgery | Admitting: Dietician

## 2022-06-15 ENCOUNTER — Encounter: Payer: Self-pay | Admitting: Skilled Nursing Facility1

## 2022-06-15 VITALS — Ht 67.0 in | Wt 273.9 lb

## 2022-06-15 DIAGNOSIS — E669 Obesity, unspecified: Secondary | ICD-10-CM | POA: Diagnosis present

## 2022-06-15 NOTE — Progress Notes (Signed)
2 Week Post-Operative Nutrition Class   Patient was seen on 06/15/2022 for Post-Operative Nutrition education at the [facility]    Surgery date: 05/31/2022 Surgery type: Sleeve Gastrectomy  Anthropometrics  Start weight at [facility]: 291.2 lbs (date: 02/08/2022)  Height: 67 in Weight today: 273.9 lbs BMI: 42.90 kg/m2     Clinical  Medical hx: Obesity, HTN, hyperlipidemia, knee replacement Medications: HCTZ, amlodipine, multivitamin  Labs: A1C 6.1 (02/25/2021) Notable signs/symptoms: walking with a cane Any previous deficiencies? No Bowel Habits: Every day to every other day no complaints    The following the learning objectives were met by the patient during this course: Identifies Soft Prepped Plan Advancement Guide  Identifies Soft, High Proteins (Phase 1), beginning 2 weeks post-operatively to 3 weeks post-operatively Identifies Additional Soft High Proteins, soft non-starchy vegetables, fruits and starches (Phase 2), beginning 3 weeks post-operatively to 3 months post-operatively Identifies appropriate sources of fluids, proteins, vegetables, fruits and starches Identifies appropriate fat sources and healthy verses unhealthy fat types   States protein, vegetable, fruit and starch recommendations and appropriate sources post-operatively Identifies the need for appropriate texture modifications, mastication, and bite sizes when consuming solids Identifies appropriate fat consumption and sources Identifies appropriate multivitamin and calcium sources post-operatively Describes the need for physical activity post-operatively and will follow MD recommendations States when to call healthcare provider regarding medication questions or post-operative complications   Handouts given during class include: Soft Prepped Plan Advancement Guide   Follow-Up Plan: Patient will follow-up at [facility] in 10 weeks for 3 month post-op nutrition visit for diet advancement  per MD.

## 2022-06-24 ENCOUNTER — Telehealth: Payer: Self-pay | Admitting: Dietician

## 2022-06-24 NOTE — Telephone Encounter (Signed)
RD called pt to verify fluid intake once starting soft, solid proteins 2 week post-bariatric surgery.   Daily Fluid intake: Daily Protein intake: Bowel Habits:   Concerns/issues:    Left Voice Message 

## 2022-09-14 ENCOUNTER — Ambulatory Visit: Payer: No Typology Code available for payment source | Admitting: Skilled Nursing Facility1

## 2022-10-01 ENCOUNTER — Encounter: Payer: Medicare HMO | Attending: Surgery | Admitting: Dietician

## 2022-10-01 ENCOUNTER — Encounter: Payer: Self-pay | Admitting: Dietician

## 2022-10-01 VITALS — Ht 67.0 in | Wt 245.3 lb

## 2022-10-01 DIAGNOSIS — E669 Obesity, unspecified: Secondary | ICD-10-CM | POA: Insufficient documentation

## 2022-10-01 DIAGNOSIS — Z4889 Encounter for other specified surgical aftercare: Secondary | ICD-10-CM | POA: Diagnosis not present

## 2022-10-01 DIAGNOSIS — Z9884 Bariatric surgery status: Secondary | ICD-10-CM | POA: Insufficient documentation

## 2022-10-01 DIAGNOSIS — E785 Hyperlipidemia, unspecified: Secondary | ICD-10-CM | POA: Insufficient documentation

## 2022-10-01 DIAGNOSIS — Z713 Dietary counseling and surveillance: Secondary | ICD-10-CM | POA: Insufficient documentation

## 2022-10-01 DIAGNOSIS — I10 Essential (primary) hypertension: Secondary | ICD-10-CM | POA: Insufficient documentation

## 2022-10-01 NOTE — Progress Notes (Signed)
Bariatric Nutrition Follow-Up Visit Medical Nutrition Therapy  Appt Start Time: 9:16   End Time: 9:45  Surgery date: 05/31/2022 Surgery type: Sleeve Gastrectomy  NUTRITION ASSESSMENT  Anthropometrics  Start weight at [facility]: 291.2 lbs (date: 02/08/2022)  Height: 67 in Weight today: 245.3 lbs   Clinical  Medical hx: Obesity, HTN, hyperlipidemia, knee replacement Medications: HCTZ, amlodipine, multivitamin  Labs: A1C 6.1 (02/25/2021) Notable signs/symptoms: walking with a cane Any previous deficiencies? No Bowel Habits: Every day to every other day no complaints  Body Composition Scale 10/01/2022  Weight  lbs 245.3  Total Body Fat  % 45.2     Visceral Fat 17  Fat-Free Mass  % 54.7     Total Body Water  % 41.8     Muscle-Mass  lbs 31.3  BMI 37.7  Body Fat Displacement ---        Torso  lbs 68.7        Left Leg  lbs 13.7        Right Leg  lbs 13.7        Left Arm  lbs 6.8        Right Arm  lbs 6.8     Lifestyle & Dietary Hx  Pt states she does not add anything to her coffee or tea, stating it is acquired, stating is it is hard.   Pt states bowel movements are getting better, stating it is usually every 3 days, rather than every 2 weeks. Pt states she is using Bisacodyl and Miralax.  Estimated daily fluid intake: 64+ oz Estimated daily protein intake: 60 g Supplements: multivitamin and calcium Current average weekly physical activity: walk and chair exercises,    24-Hr Dietary Recall First Meal: protein shake, later... tenderloin, egg, toast Snack:   Second Meal: chicken salad sandwich, fruit Snack: yogurt  Third Meal: hamburger steak, broccoli, potatoes Snack: sf pop cycle  Beverages: coffee, water, tea, alkaline water  Post-Op Goals/ Signs/ Symptoms Using straws: no Drinking while eating: no Chewing/swallowing difficulties: no Changes in vision: no Changes to mood/headaches: no Hair loss/changes to skin/nails: no Difficulty focusing/concentrating:  no Sweating: no Limb weakness: no Dizziness/lightheadedness: no Palpitations: no  Carbonated/caffeinated beverages: no N/V/D/C/Gas: no Abdominal pain: no Dumping syndrome: no   NUTRITION DIAGNOSIS  Overweight/obesity (F[place]-3.3) related to past poor dietary habits and physical inactivity as evidenced by completed bariatric surgery and following dietary guidelines for continued weight loss and healthy nutrition status.   NUTRITION INTERVENTION Nutrition counseling (C-1) and education (E-2) to facilitate bariatric surgery goals, including: Diet advancement to the Standard Prep Plan The importance of consuming adequate calories as well as certain nutrients daily due to the body's need for essential vitamins, minerals, and fats The importance of daily physical activity and to reach a goal of at least 150 minutes of moderate to vigorous physical activity weekly (or as directed by their physician) due to benefits such as increased musculature and improved lab values The importance of intuitive eating specifically learning hunger-satiety cues and understanding the importance of learning a new body: The importance of mindful eating to avoid grazing behaviors   Handouts Provided Include  Standard Prep Plan Advancement Guide  Learning Style & Readiness for Change Teaching method utilized: Visual & Auditory  Demonstrated degree of understanding via: Teach Back  Readiness Level: Preparation Barriers to learning/adherence to lifestyle change: mobility  RD's Notes for Next Visit Assess adherence to pt chosen goals  MONITORING & EVALUATION Dietary intake, weekly physical activity, body weight.  Next Steps Patient  is to follow-up in 2 months for 6 month post-op follow-up.

## 2022-11-19 ENCOUNTER — Other Ambulatory Visit (HOSPITAL_COMMUNITY): Payer: Self-pay

## 2022-12-03 ENCOUNTER — Encounter: Payer: No Typology Code available for payment source | Attending: Surgery | Admitting: Dietician

## 2022-12-03 ENCOUNTER — Encounter: Payer: Self-pay | Admitting: Dietician

## 2022-12-03 VITALS — Ht 67.0 in | Wt 233.1 lb

## 2022-12-03 DIAGNOSIS — E669 Obesity, unspecified: Secondary | ICD-10-CM | POA: Diagnosis present

## 2022-12-03 DIAGNOSIS — Z6836 Body mass index (BMI) 36.0-36.9, adult: Secondary | ICD-10-CM | POA: Insufficient documentation

## 2022-12-03 NOTE — Progress Notes (Signed)
Bariatric Nutrition Follow-Up Visit Medical Nutrition Therapy  Appt Start Time: 9.31  End Time: 9:46  Pt arrived late; abbreviated visit  Surgery date: 05/31/2022 Surgery type: Sleeve Gastrectomy  NUTRITION ASSESSMENT  Anthropometrics  Start weight at [facility]: 291.2 lbs (date: 02/08/2022)  Height: 67 in Weight today: 233.1 lbs   Clinical  Medical hx: Obesity, HTN, hyperlipidemia, knee replacement Medications: HCTZ, amlodipine, multivitamin  Labs: A1C 6.1 (02/25/2021) Notable signs/symptoms: walking with a cane Any previous deficiencies? No Bowel Habits: Every day to every other day no complaints  Body Composition Scale 10/01/2022 12/03/2022  Weight  lbs 245.3 233.1  Total Body Fat  % 45.2 44.3     Visceral Fat 17 16  Fat-Free Mass  % 54.7 55.6     Total Body Water  % 41.8 42.3     Muscle-Mass  lbs 31.3 30.7  BMI 37.7 36.2  Body Fat Displacement ---         Torso  lbs 68.7 64.0        Left Leg  lbs 13.7 12.8        Right Leg  lbs 13.7 12.8        Left Arm  lbs 6.8 6.4        Right Arm  lbs 6.8 6.4   Lifestyle & Dietary Hx  Pt asked if she can have fried food, stating she is going to an event that will only have fried food.   Estimated daily fluid intake: 64 oz Estimated daily protein intake: 60 grams Supplements: multivitamin and calcium Current average weekly physical activity: walking daily, 20-30 minutes   24-Hr Dietary Recall First Meal: Protein shake or scrambled egg with toast Snack: smoothie (fruit with 2% milk or water, add cinnamon or tumeric)  Second Meal: grilled chicken and salad Snack: sf pop cycles  Third Meal: chicken, green beans, stewed potatoes Snack: pop cycles Beverages: water, 2%milk, tea, coffee, apple juice  Post-Op Goals/ Signs/ Symptoms Using straws: no Drinking while eating: no Chewing/swallowing difficulties: no Changes in vision: no Changes to mood/headaches: no Hair loss/changes to skin/nails: no Difficulty  focusing/concentrating: no Sweating: no Limb weakness: no Dizziness/lightheadedness: no Palpitations: no  Carbonated/caffeinated beverages: no N/V/D/C/Gas: no Abdominal pain: no Dumping syndrome: no   NUTRITION DIAGNOSIS  Overweight/obesity ([place] II-3.3) related to past poor dietary habits and physical inactivity as evidenced by completed bariatric surgery and following dietary guidelines for continued weight loss and healthy nutrition status.   NUTRITION INTERVENTION Nutrition counseling (C-1) and education (E-2) to facilitate bariatric surgery goals, including: The importance of consuming adequate calories as well as certain nutrients daily due to the body's need for essential vitamins, minerals, and fats The importance of daily physical activity and to reach a goal of at least 150 minutes of moderate to vigorous physical activity weekly (or as directed by their physician) due to benefits such as increased musculature and improved lab values The importance of intuitive eating specifically learning hunger-satiety cues and understanding the importance of learning a new body: The importance of mindful eating to avoid grazing behaviors Encouraged patient to honor their body's internal hunger and fullness cues.  Throughout the day, check in mentally and rate hunger. Stop eating when satisfied not full regardless of how much food is left on the plate.  Get more if still hungry 20-30 minutes later.  The key is to honor satisfaction so throughout the meal, rate fullness factor and stop when comfortably satisfied not physically full. The key is to honor hunger and fullness  without any feelings of guilt or shame.  Pay attention to what the internal cues are, rather than any external factors. This will enhance the confidence you have in listening to your own body and following those internal cues enabling you to increase how often you eat when you are hungry not out of appetite and stop when you are satisfied  not full.  Encouraged pt to continue to eat balanced meals inclusive of non starchy vegetables 2 times a day 7 days a week Encouraged pt to choose lean protein sources: limiting beef, pork, sausage, hotdogs, and lunch meat Encourage pt to choose healthy fats such as plant based limiting animal fats Encouraged pt to continue to drink a minium 64 fluid ounces with half being plain water to satisfy proper hydration   Encouraged pt to avoid fruit juice or other sugar sweetened beverages. Encouraged pt to avoid extra calories and fat from fried foods.  Handouts Provided Include  6 Months to Maintenance, Healthy Eating Guide  Learning Style & Readiness for Change Teaching method utilized: Visual & Auditory  Demonstrated degree of understanding via: Teach Back  Readiness Level: preparation Barriers to learning/adherence to lifestyle change: mobility  RD's Notes for Next Visit Assess adherence to pt chosen goals  MONITORING & EVALUATION Dietary intake, weekly physical activity, body weight.  Next Steps Patient is to follow-up in 3 months for 9 month post-op follow-up.

## 2023-07-21 DIAGNOSIS — R1031 Right lower quadrant pain: Secondary | ICD-10-CM | POA: Diagnosis not present

## 2023-07-21 DIAGNOSIS — M545 Low back pain, unspecified: Secondary | ICD-10-CM | POA: Diagnosis not present
